# Patient Record
Sex: Female | Born: 2000 | Race: White | Hispanic: No | Marital: Single
Health system: Midwestern US, Community
[De-identification: ages and names within clinical notes are randomized; demographics above are authoritative.]

## PROBLEM LIST (undated history)

## (undated) DIAGNOSIS — F900 Attention-deficit hyperactivity disorder, predominantly inattentive type: Secondary | ICD-10-CM

## (undated) DIAGNOSIS — Z0000 Encounter for general adult medical examination without abnormal findings: Secondary | ICD-10-CM

## (undated) DIAGNOSIS — J324 Chronic pansinusitis: Secondary | ICD-10-CM

## (undated) DIAGNOSIS — S52599A Other fractures of lower end of unspecified radius, initial encounter for closed fracture: Secondary | ICD-10-CM

## (undated) DIAGNOSIS — M0880 Other juvenile arthritis, unspecified site: Secondary | ICD-10-CM

## (undated) DIAGNOSIS — M25462 Effusion, left knee: Principal | ICD-10-CM

## (undated) DIAGNOSIS — M25461 Effusion, right knee: Secondary | ICD-10-CM

## (undated) DIAGNOSIS — M1990 Unspecified osteoarthritis, unspecified site: Secondary | ICD-10-CM

## (undated) HISTORY — DX: Unspecified osteoarthritis, unspecified site: M19.90

## (undated) HISTORY — PX: WISDOM TOOTH EXTRACTION: SHX21

## (undated) MED ORDER — LIDOCAINE HCL 1 % (10 MG/ML) IJ SOLN
10 mg/mL (1 %) | Freq: Once | INTRAMUSCULAR | Status: AC
Start: ? — End: 2013-02-06

## (undated) MED ORDER — NAPROXEN 375 MG TAB
375 mg | ORAL_TABLET | Freq: Two times a day (BID) | ORAL | Status: DC
Start: ? — End: 2014-08-21

## (undated) MED ORDER — NAPROXEN 500 MG TAB
500 mg | ORAL_TABLET | Freq: Every day | ORAL | Status: DC
Start: ? — End: 2013-06-28

## (undated) MED ORDER — NAPROXEN 500 MG TAB
500 mg | ORAL_TABLET | Freq: Two times a day (BID) | ORAL | Status: AC
Start: ? — End: 2013-02-10

## (undated) MED ORDER — NAPROXEN 375 MG TAB
375 mg | ORAL_TABLET | Freq: Every day | ORAL | Status: DC
Start: ? — End: 2013-06-28

---
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
## 2021-01-07 ENCOUNTER — Encounter

## 2021-01-07 NOTE — Telephone Encounter (Signed)
PT referral made for LE pain ; follow up prn

## 2021-01-07 NOTE — Telephone Encounter (Signed)
Telephone Encounter by Ysidro Evert, MD at 01/07/21 1907                Author: Ysidro Evert, MD  Service: --  Author Type: Physician       Filed: 01/07/21 1907  Encounter Date: 01/07/2021  Status: Signed          Editor: Ysidro Evert, MD (Physician)          From: Daivd Council: Ysidro Evert, MDSent: 01/07/2021  4:07 PM EDTSubject: Physical TherapyHi Dr. Bertrum Sol hope you are doing well!  In the past few weeks, my leg issues have worsened and spread as well. I have had pain in my shins, top and bottom of foot, knees, and hip when I walk even short distances. My mom told me that I would need a prescription from you before going to physical  therapy and got an appointment at [facility]. Thank you so much for your help!!

## 2021-02-10 ENCOUNTER — Ambulatory Visit: Admit: 2021-02-10 | Attending: Internal Medicine | Primary: Internal Medicine

## 2021-02-10 ENCOUNTER — Ambulatory Visit: Attending: Internal Medicine | Primary: Internal Medicine

## 2021-02-10 DIAGNOSIS — Z0000 Encounter for general adult medical examination without abnormal findings: Secondary | ICD-10-CM

## 2021-02-10 LAB — AMB POC URINALYSIS DIP STICK AUTO W/O MICRO
Bilirubin (UA POC): NEGATIVE
Bilirubin, Urine, POC: NEGATIVE
Blood (UA POC): NEGATIVE
Blood (UA POC): NEGATIVE
Glucose (UA POC): NEGATIVE
Glucose, Urine, POC: NEGATIVE
Ketones (UA POC): NEGATIVE
Ketones, Urine, POC: NEGATIVE
Leukocyte Esterase, Urine, POC: NEGATIVE
Leukocyte esterase (UA POC): NEGATIVE
Nitrite, Urine, POC: NEGATIVE
Nitrites (UA POC): NEGATIVE
Protein (UA POC): NEGATIVE
Protein, Urine, POC: NEGATIVE
Specific Gravity, Urine, POC: 1.02 NA (ref 1.001–1.035)
Specific gravity (UA POC): 1.02 (ref 1.001–1.035)
Urobilinogen (UA POC): 0.2 (ref 0.2–1)
Urobilinogen, POC: 0.2 (ref 0.2–1)
pH (UA POC): 6 (ref 4.6–8.0)
pH, Urine, POC: 6 NA (ref 4.6–8.0)

## 2021-02-10 LAB — AMB POC HEMOGLOBIN A1C
Hemoglobin A1C, POC: 4.6 % — AB (ref 4.8–5.6)
Hemoglobin A1c (POC): 4.6 % — AB (ref 4.8–5.6)

## 2021-02-10 NOTE — Progress Notes (Signed)
Left VM for mother with results. Continue same rx. Follow up prn

## 2021-02-10 NOTE — Progress Notes (Signed)
Gaia Gullikson is a 20 y.o. Itzel Lowrimore/f CC: Routine Physical     HPI  Routine Physical     Past Medical History:   Diagnosis Date   ??? ADD (attention deficit disorder)    ??? Arthritis     Juvenille Idiopathic Arthritis . oligo articular   ??? History of seasonal allergies    ??? Patellofemoral arthritis      No past surgical history on file.  Current Outpatient Medications   Medication Sig Dispense Refill   ??? Vyvanse 60 mg capsule TAKE ONE CAPSULE BY MOUTH EVERY DAY.Marland KitchenMAX DAILY AMOUNT 60MG  90 Capsule 0   ??? methylphenidate HCl (RITALIN) 10 mg tablet Take 1 Tablet by mouth daily. Max Daily Amount: 10 mg. In the mid afternoon 50 Tablet 0   ??? Lo Loestrin Fe 1 mg-10 mcg (24)/10 mcg (2) tab      ??? naproxen sodium (NAPROSYN) 220 mg tablet Take 220 mg by mouth as needed (Takes as directed by the doctor). Indications: joint inflammatory disease in children and young adults (Patient not taking: Reported on 01/26/2020)       Allergies   Allergen Reactions   ??? Accutane [Isotretinoin] Rash     No family history on file.  Social History     Tobacco Use   Smoking Status Never Smoker   Smokeless Tobacco Never Used       Review of Systems  Am thirst, headache, dry lips. Vision ok . Denies dyspnea.  Cardiac:  Denies chest pain  GI: Bowels move regularly without hematochezia..  With increased water intake , urinary frequency . Lo esterin . Dr 01/28/2020. No menses.   She had B/l leg pain responded well to Right hip PT  Passes Depression screen     Physical Examination:  Visit Vitals  BP 94/72   Temp 97.9 ??F (36.6 ??C)   Ht 5\' 10"  (1.778 m)   Wt 172 lb (78 kg)   BMI 24.68 kg/m??     Physical Exam  General:   Appears in no acute distress.   HEENT:   Negative                   Lungs:   Clear        Heart:  Regular without murmur, gallop or rub       Abdomen:   Benign exam without organomegaly or mass palpable                Rectal:     Extremities: No edema hips FROM    Neurologic: Grossly nonfocal  .    No results found for this or any previous  visit (from the past 8 hour(s)).              Results for orders placed or performed in visit on 02/10/21   AMB POC URINALYSIS DIP STICK AUTO Vinayak Bobier/O MICRO   Result Value Ref Range    Color (UA POC) Yellow     Clarity (UA POC) Clear     Glucose (UA POC) Negative Negative    Bilirubin (UA POC) Negative Negative    Ketones (UA POC) Negative Negative    Specific gravity (UA POC) 1.020 1.001 - 1.035    Blood (UA POC) Negative Negative    pH (UA POC) 6.0 4.6 - 8.0    Protein (UA POC) Negative Negative    Urobilinogen (UA POC) 0.2 mg/dL 0.2 - 1    Nitrites (UA POC) Negative Negative  Leukocyte esterase (UA POC) Negative Negative   AMB POC HEMOGLOBIN A1C   Result Value Ref Range    Hemoglobin A1c (POC) 4.6 (A) 4.8 - 5.6 %     Assessment/Plan    ICD-10-CM ICD-9-CM    1. Routine general medical examination at a health care facility  Z00.00 V70.0 COLLECTION VENOUS BLOOD,VENIPUNCTURE      LIPID PANEL      CBC WITH AUTOMATED DIFF      METABOLIC PANEL, COMPREHENSIVE      AMB POC URINALYSIS DIP STICK AUTO Natasha Burda/O MICRO      AMB POC HEMOGLOBIN A1C   2. ADHD (attention deficit hyperactivity disorder), inattentive type  F90.0 314.00 OPIATES CONFIRMATION, URINE     Continue home exercises   Check UDS. PMP appears in order  Continue routine f/u Dr Malachy Chamber   Repeat UDS in 6 m and CPE in 1y ; f/u sooner prn     Author:  Ysidro Evert, MD 2:20 PM6/02/2021

## 2021-02-11 LAB — COMPREHENSIVE METABOLIC PANEL
ALT: 16 IU/L (ref 0–32)
AST: 18 IU/L (ref 0–40)
Albumin/Globulin Ratio: 1.8 NA (ref 1.2–2.2)
Albumin: 4.6 g/dL (ref 3.9–5.0)
Alkaline Phosphatase: 57 IU/L (ref 42–106)
BUN: 8 mg/dL (ref 6–20)
Bun/Cre Ratio: 12 NA (ref 9–23)
CO2: 24 mmol/L (ref 20–29)
Calcium: 9.4 mg/dL (ref 8.7–10.2)
Chloride: 104 mmol/L (ref 96–106)
Creatinine: 0.68 mg/dL (ref 0.57–1.00)
Est, Glomerular Filtration Rate: 129 mL/min/{1.73_m2} (ref >59)
Globulin, Total: 2.6 g/dL (ref 1.5–4.5)
Glucose: 89 mg/dL (ref 65–99)
Potassium: 4.5 mmol/L (ref 3.5–5.2)
Sodium: 142 mmol/L (ref 134–144)
Total Bilirubin: 0.2 mg/dL (ref 0.0–1.2)
Total Protein: 7.2 g/dL (ref 6.0–8.5)

## 2021-02-11 LAB — CBC WITH AUTO DIFFERENTIAL
Basophils %: 1 %
Basophils Absolute: 0.1 10*3/uL (ref 0.0–0.2)
Eosinophils %: 3 %
Eosinophils Absolute: 0.1 10*3/uL (ref 0.0–0.4)
Granulocyte Absolute Count: 0 10*3/uL (ref 0.0–0.1)
Hematocrit: 40.7 % (ref 34.0–46.6)
Hemoglobin: 13.9 g/dL (ref 11.1–15.9)
Immature Granulocytes: 0 %
Lymphocytes %: 44 %
Lymphocytes Absolute: 1.9 10*3/uL (ref 0.7–3.1)
MCH: 30.8 pg (ref 26.6–33.0)
MCHC: 34.2 g/dL (ref 31.5–35.7)
MCV: 90 fL (ref 79–97)
Monocytes %: 8 %
Monocytes Absolute: 0.3 10*3/uL (ref 0.1–0.9)
Neutrophils %: 44 %
Neutrophils Absolute: 1.9 10*3/uL (ref 1.4–7.0)
Platelets: 301 10*3/uL (ref 150–450)
RBC: 4.52 x10E6/uL (ref 3.77–5.28)
RDW: 11.6 % — ABNORMAL LOW (ref 11.7–15.4)
WBC: 4.3 10*3/uL (ref 3.4–10.8)

## 2021-02-11 LAB — LIPID PANEL
Cholesterol, Total: 213 mg/dL — ABNORMAL HIGH (ref 100–169)
Cholesterol, total: 213 mg/dL — ABNORMAL HIGH (ref 100–169)
HDL Cholesterol: 60 mg/dL (ref >39)
HDL: 60 mg/dL (ref >39)
LDL Calculated: 137 mg/dL — ABNORMAL HIGH (ref 0–109)
LDL, calculated: 137 mg/dL — ABNORMAL HIGH (ref 0–109)
Triglyceride: 92 mg/dL — ABNORMAL HIGH (ref 0–89)
Triglycerides: 92 mg/dL — ABNORMAL HIGH (ref 0–89)
VLDL, calculated: 16 mg/dL (ref 5–40)
VLDL: 16 mg/dL (ref 5–40)

## 2021-02-11 LAB — CBC WITH AUTOMATED DIFF
ABS. BASOPHILS: 0.1 10*3/uL (ref 0.0–0.2)
ABS. EOSINOPHILS: 0.1 10*3/uL (ref 0.0–0.4)
ABS. IMM. GRANS.: 0 10*3/uL (ref 0.0–0.1)
ABS. MONOCYTES: 0.3 10*3/uL (ref 0.1–0.9)
ABS. NEUTROPHILS: 1.9 10*3/uL (ref 1.4–7.0)
Abs Lymphocytes: 1.9 10*3/uL (ref 0.7–3.1)
BASOPHILS: 1 %
EOSINOPHILS: 3 %
HCT: 40.7 % (ref 34.0–46.6)
HGB: 13.9 g/dL (ref 11.1–15.9)
IMMATURE GRANULOCYTES: 0 %
Lymphocytes: 44 %
MCH: 30.8 pg (ref 26.6–33.0)
MCHC: 34.2 g/dL (ref 31.5–35.7)
MCV: 90 fL (ref 79–97)
MONOCYTES: 8 %
NEUTROPHILS: 44 %
PLATELET: 301 10*3/uL (ref 150–450)
RBC: 4.52 x10E6/uL (ref 3.77–5.28)
RDW: 11.6 % — ABNORMAL LOW (ref 11.7–15.4)
WBC: 4.3 10*3/uL (ref 3.4–10.8)

## 2021-02-11 LAB — METABOLIC PANEL, COMPREHENSIVE
A-G Ratio: 1.8 (ref 1.2–2.2)
ALT (SGPT): 16 IU/L (ref 0–32)
AST (SGOT): 18 IU/L (ref 0–40)
Albumin: 4.6 g/dL (ref 3.9–5.0)
Alk. phosphatase: 57 IU/L (ref 42–106)
BUN/Creatinine ratio: 12 (ref 9–23)
BUN: 8 mg/dL (ref 6–20)
Bilirubin, total: 0.2 mg/dL (ref 0.0–1.2)
CO2: 24 mmol/L (ref 20–29)
Calcium: 9.4 mg/dL (ref 8.7–10.2)
Chloride: 104 mmol/L (ref 96–106)
Creatinine: 0.68 mg/dL (ref 0.57–1.00)
GLOBULIN, TOTAL: 2.6 g/dL (ref 1.5–4.5)
Glucose: 89 mg/dL (ref 65–99)
Potassium: 4.5 mmol/L (ref 3.5–5.2)
Protein, total: 7.2 g/dL (ref 6.0–8.5)
Sodium: 142 mmol/L (ref 134–144)
eGFR: 129 mL/min/{1.73_m2} (ref >59)

## 2021-02-14 ENCOUNTER — Encounter

## 2021-02-14 MED ORDER — LISDEXAMFETAMINE 20 MG CAP
20 mg | ORAL_CAPSULE | Freq: Every day | ORAL | 0 refills | Status: DC
Start: 2021-02-14 — End: 2021-02-19

## 2021-02-14 NOTE — Telephone Encounter (Signed)
Telephone Encounter by Ysidro Evert, MD at 02/14/21 1343                Author: Ysidro Evert, MD  Service: --  Author Type: Physician       Filed: 02/14/21 1343  Encounter Date: 02/14/2021  Status: Signed          Editor: Ysidro Evert, MD (Physician)          From: Daivd Council: Ysidro Evert, MDSent: 02/14/2021 10:20 AM EDTSubject: Vyvanse Prescription Hi Dr. Doreene Eland! I hope you are doing  well. I was wondering if you could please refill my Vyvanse prescription so I can pick it up at the pharmacy in a few days. Thank you!

## 2021-02-18 ENCOUNTER — Telehealth

## 2021-02-18 MED ORDER — LISDEXAMFETAMINE 20 MG CAP
20 mg | ORAL_CAPSULE | Freq: Every day | ORAL | 0 refills | Status: DC
Start: 2021-02-18 — End: 2021-02-19

## 2021-02-18 NOTE — Telephone Encounter (Signed)
Dr. Doreene Eland,  Patient wrote in the wrong dosage for her VYVANSE she needs 60 mg of that so could you please send in the     VYVANSE 60 mg - 90 days     Pt ph# [phone]    [facility] ph# [phone]

## 2021-02-18 NOTE — Telephone Encounter (Signed)
I prescribed Vyvanse 20 mg #90  As per pt 's request . This replaces the recent 3 prescriptions for 30 tabs each.

## 2021-02-18 NOTE — Addendum Note (Signed)
Addendum Note by Ysidro Evert, MD at 02/18/21 1325                Author: Ysidro Evert, MD  Service: --  Author Type: Physician       Filed: 02/18/21 1325  Encounter Date: 02/14/2021  Status: Signed          Editor: Ysidro Evert, MD (Physician)          Addended by: Coralyn Pear IV on: 02/18/2021 01:25 PM    Modules accepted: Orders

## 2021-02-19 LAB — OPIATES CONFIRMATION, URINE: Opiates: NEGATIVE ng/mL

## 2021-02-19 MED ORDER — LISDEXAMFETAMINE 60 MG CAPSULE
60 mg | ORAL_CAPSULE | ORAL | 0 refills | Status: DC
Start: 2021-02-19 — End: 2021-05-08

## 2021-02-19 NOTE — Telephone Encounter (Signed)
Vyvanse 60 mg ERX

## 2021-03-19 ENCOUNTER — Encounter: Attending: Orthopaedic Surgery | Primary: Internal Medicine

## 2021-04-15 ENCOUNTER — Ambulatory Visit: Admit: 2021-04-15 | Payer: BLUE CROSS/BLUE SHIELD | Attending: Rheumatology | Primary: Internal Medicine

## 2021-04-15 ENCOUNTER — Ambulatory Visit: Attending: Rheumatology | Primary: Internal Medicine

## 2021-04-15 DIAGNOSIS — M2550 Pain in unspecified joint: Secondary | ICD-10-CM

## 2021-04-15 MED ORDER — MELOXICAM 15 MG TAB
15 mg | ORAL_TABLET | Freq: Every day | ORAL | 1 refills | Status: AC
Start: 2021-04-15 — End: 2021-05-15

## 2021-04-15 MED ORDER — DICLOFENAC 1 % TOPICAL GEL
1 % | Freq: Four times a day (QID) | CUTANEOUS | 3 refills | Status: AC
Start: 2021-04-15 — End: 2021-05-15

## 2021-04-15 NOTE — Progress Notes (Signed)
Chief Complaint   Patient presents with    Joint Pain     1. Have you been to the ER, urgent care clinic since your last visit?  Hospitalized since your last visit?No    2. Have you seen or consulted any other health care providers outside of the [facility] since your last visit?  Include any pap smears or colon screening. No

## 2021-04-15 NOTE — Progress Notes (Signed)
RHEUMATOLOGY PROBLEM LIST AND CHIEF COMPLAINT  1. 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 Caucasian female.  Today, the patient complains of pain in the joints.  Location: knee and ankles  Severity:  8 on a scale of 0-10  Timing: all day   Duration: Over 2 months  Context/Associated signs and symptoms: The patient was last seen in May 2021 however has not actually had an inflammatory arthritis since 2014.  She has been seen in the past for mechanical joint pain as well as a rash secondary to Accutane.  Over the last few months while at camp she has developed significant pain in her knees, shinsplints and ankle pain.  She attributes this to walking and activity that has been taking place at camp.  While she was away at school in [place] she also began to have pain in her knees as well as shinsplints from activity.  She denies morning stiffness however does say that her knees feel swollen similar to when they were swollen when she was diagnosed with juvenile arthritis.  She has not had fevers or rashes is doing well otherwise.  She is currently taking Aleve daily and her high doses as well as supplementing it with Tylenol.    RHEUMATOLOGY REVIEW OF SYSTEMS   Positives as per history  Negatives as follows:  CONSTITUTlONAL:  Denies unexplained persistent fevers, weight change, chronic fatigue  HEAD/EYES:   Denies eye redness, blurry vision or sudden loss of vision, dry eyes, HA, temporal artery pain  ENT:    Denies oral/nasal ulcers, recurrent sinus infections, dry mouth  RESPIRATORY:  No pleuritic pain, history of pleural effusions, hemoptysis, exertional dyspnea  CARDIOVASCULAR: Denies chest pain, history of pericardial effusions  GASTRO:   Denies heartburn, esophageal dysmotility, abdominal pain, nausea, vomiting, diarrhea, blood in the  stool  HEMATOLOGIC:  No easy bruising, purpura, swollen lymph nodes  SKIN:    Denies alopecia, ulcers, nodules, sun sensitivity  VASCULAR:   Denies edema, cyanosis, raynaud phenomenon  NEUROLOGIC:  Denies specific muscle weakness, paresthesias   PSYCHIATRIC:  No sleep disturbance / snoring, depression, anxiety  MSK:    No morning stiffness >1 hour, SI joint pain, persistent joint swelling, persistent joint pain    PAST MEDICAL HISTORY  Reviewed with patient, significant changes in medical history - no    PHYSICAL EXAM  Blood pressure 125/79, pulse 90, temperature 98.7 ??F (37.1 ??C), temperature source Oral, resp. rate 16, weight 160 lb (72.6 kg), SpO2 98 %.  GENERAL APPEARANCE: Well-nourished, no acute distress  NECK: No adenopathy  ENT: No oral ulcers  CARDIOVASCULAR: Heart rhythm is regular. No murmur, rub, gallop  CHEST: Normal vesicular breath sounds. No wheezes, rales, pleural friction rubs  ABDOMINAL: The abdomen is soft and nontender. Bowel sounds are normal  SKIN: No rash, palpable purpura, digital ulcer, abnormal thickening   MUSCULOSKELETAL:   Upper extremities - full range of motion, no tenderness, no swelling, no synovial thickening and no deformity of joints   Lower extremities - full range of motion, no tenderness, no swelling, no synovial thickening and no deformity of joints positive grind test, no synovitis noted in either knee subtle swelling of bilateral knee with no warmth or decreased range of motion    LABS, RADIOLOGY AND PROCEDURES   No new labs, radiology, or procedures to review  ASSESSMENT   1. Juvenile idiopathic arthritis -(is unchanged)-I do not suspect that her current symptoms are from a flare of juvenile arthritis.  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  I discussed using other NSAIDs instead of Aleve since this is not providing relief as well as using topical diclofenac gel.  If the symptoms  persist we will obtain an MRI however at this point I feel like after she stops can she should start to feel better.  The constant uphill and downhill walking is likely contributing to this discomfort.  2. 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  There is pain and crepitus in the patellar region which is worsened during overactivity like climbing stairs.  There was a positive patella grind test.  The treatment is quadriceps strengthening through stretching along with the use of NSAIDs and avoidance of overuse.     PLAN  1.  Recommended quadriceps strengthening and grasping exercises  2.  Diclofenac gel and meloxicam as needed   3. Follow up as needed     Camika Marsico M. Allena Katz, MD  Adult and Pediatric Rheumatology     [facility]   [address] [place], [place] [address], Phone [phone], Fax [phone]     Visiting Assistant Professor of Pediatrics    Department of Pediatrics, [facility]   [address], [place], [place] [address], Phone [phone], Fax [phone]    There are no Patient Instructions on file for this visit.    cc:  Ysidro Evert, MD   S. Philis Nettle, MD

## 2021-05-08 ENCOUNTER — Encounter

## 2021-05-09 ENCOUNTER — Telehealth

## 2021-05-09 MED ORDER — LISDEXAMFETAMINE 60 MG CAPSULE
60 mg | ORAL_CAPSULE | Freq: Every day | ORAL | 0 refills | Status: AC
Start: 2021-05-09 — End: 2021-10-27

## 2021-05-09 MED ORDER — LISDEXAMFETAMINE 60 MG CAPSULE
60 mg | ORAL_CAPSULE | ORAL | 0 refills | Status: AC
Start: 2021-05-09 — End: 2021-08-17

## 2021-05-09 MED ORDER — LISDEXAMFETAMINE 60 MG CAPSULE
60 mg | ORAL_CAPSULE | Freq: Every day | ORAL | 0 refills | Status: AC
Start: 2021-05-09 — End: 2021-12-12

## 2021-05-09 NOTE — Telephone Encounter (Signed)
ERX Vyvanse generic sent to pharmacy. PMP appears in order

## 2021-05-09 NOTE — Telephone Encounter (Signed)
PMP appears in order. Vyvanse ordered. I called pharmacy

## 2021-05-09 NOTE — Telephone Encounter (Signed)
Pharmacy called and wanted to inform the doctor about the prescription for Vyvanse 60 mg for 90 day supply that they received today. Pharmacy stated the insurance will only cover for 30 days. Please send in another script for 30 days so they may have on file for the other half of the medication.  Please call     [facility]# [phone]

## 2021-06-19 ENCOUNTER — Ambulatory Visit: Admit: 2021-06-19 | Discharge: 2021-06-20 | Attending: Internal Medicine | Primary: Internal Medicine

## 2021-06-19 ENCOUNTER — Ambulatory Visit: Attending: Internal Medicine | Primary: Internal Medicine

## 2021-06-19 DIAGNOSIS — Z0000 Encounter for general adult medical examination without abnormal findings: Secondary | ICD-10-CM

## 2021-06-19 LAB — AMB POC URINALYSIS DIP STICK AUTO W/O MICRO
Bilirubin (UA POC): NEGATIVE
Bilirubin, Urine, POC: NEGATIVE
Blood (UA POC): NEGATIVE
Blood (UA POC): NEGATIVE
Glucose (UA POC): NEGATIVE
Glucose, Urine, POC: NEGATIVE
Ketones (UA POC): NEGATIVE
Ketones, Urine, POC: NEGATIVE
Leukocyte Esterase, Urine, POC: NEGATIVE
Leukocyte esterase (UA POC): NEGATIVE
Nitrite, Urine, POC: NEGATIVE
Nitrites (UA POC): NEGATIVE
Protein (UA POC): NEGATIVE
Protein, Urine, POC: NEGATIVE
Specific Gravity, Urine, POC: 1.01 NA (ref 1.001–1.035)
Specific gravity (UA POC): 1.01 (ref 1.001–1.035)
Urobilinogen (UA POC): 0.2 (ref 0.2–1)
Urobilinogen, POC: 0.2 (ref 0.2–1)
pH (UA POC): 7 (ref 4.6–8.0)
pH, Urine, POC: 7 NA (ref 4.6–8.0)

## 2021-06-19 MED ORDER — AMPHETAMINE-DEXTROAMPHETAMINE 10 MG TAB
10 mg | ORAL_TABLET | Freq: Every day | ORAL | 0 refills | Status: AC
Start: 2021-06-19 — End: 2021-07-21

## 2021-06-19 NOTE — Progress Notes (Signed)
Elaine Terrell is a 20 y.o. wf  CC: Routine Physical     HPI    Routine Physical   Past Medical History:   Diagnosis Date    ADD (attention deficit disorder)     Arthritis     Juvenille Idiopathic Arthritis . oligo articular    History of seasonal allergies     Patellofemoral arthritis      No past surgical history on file.  Current Outpatient Medications   Medication Sig Dispense Refill    OTHER Culturelle one daily . Beano daly      dextroamphetamine-amphetamine (ADDERALL) 10 mg tablet Take 1 Tablet by mouth daily. Max Daily Amount: 10 mg. 30 Tablet 0    Lisdexamfetamine (Vyvanse) 60 mg capsule TAKE ONE CAPSULE BY MOUTH EVERY DAY.Marland KitchenMAX DAILY AMOUNT 60MG  90 Capsule 0    Lisdexamfetamine (VYVANSE) 60 mg capsule Take 1 Capsule by mouth daily. Max Daily Amount: 60 mg. 30 Capsule 0    Lisdexamfetamine (VYVANSE) 60 mg capsule Take 1 Capsule by mouth daily. Max Daily Amount: 60 mg. 30 Capsule 0    B.animalis,bifid,infantis,long (Probiotic 4X) 10-15 mg TbEC Take  by mouth.      spironolactone (ALDACTONE) 25 mg tablet Take 50 mg by mouth in the morning.      Lo Loestrin Fe 1 mg-10 mcg (24)/10 mcg (2) tab       naproxen sodium (NAPROSYN) 220 mg tablet Take 220 mg by mouth as needed (Takes as directed by the doctor). Indications: joint inflammatory disease in children and young adults       Allergies   Allergen Reactions    Accutane [Isotretinoin] Rash    Ritalin [Methylphenidate] Other (comments)     Palpitations      No family history on file.  Social History     Tobacco Use   Smoking Status Never   Smokeless Tobacco Never       Review of Systems  Denies dyspnea.  Cardiac:  Denies chest pai  GI: low FODMAP , off Meloxicam, on Culturelle, Beano now abd feels ok . DR  is her GI . She says her bm's wels move regularly without hematochezia.Inda Coke  Urination without difficulty. On OCP ; no Menses. GYN UTD. Legs better Christna Kulick/ PT  Ritalin irriatalbe and palpitations.     Physical Examination:  Visit Vitals  BP 112/82   Temp  97.7 ??F (36.5 ??C)   Ht 5\' 11"  (1.803 m)   Wt 160 lb (72.6 kg)   BMI 22.32 kg/m??     Physical Exam  General:   Appears in no acute distress.   HEENT:   Negative                   Lungs:   Clear        Heart:  Regular without murmur, gallop or rub       Abdomen:   Benign exam without organomegaly or mass palpable                Rectal:     Extremities: No edema le from l no swelling    Neurologic: Grossly nonfocal  .    Recent Results (from the past 8 hour(s))   AMB POC URINALYSIS DIP STICK AUTO Kerriann Kamphuis/O MICRO    Collection Time: 06/19/21 11:39 AM   Result Value Ref Range    Color (UA POC) Yellow     Clarity (UA POC) Clear     Glucose (UA POC) Negative Negative  Bilirubin (UA POC) Negative Negative    Ketones (UA POC) Negative Negative    Specific gravity (UA POC) 1.010 1.001 - 1.035    Blood (UA POC) Negative Negative    pH (UA POC) 7.0 4.6 - 8.0    Protein (UA POC) Negative Negative    Urobilinogen (UA POC) 0.2 mg/dL 0.2 - 1    Nitrites (UA POC) Negative Negative    Leukocyte esterase (UA POC) Negative Negative                   Assessment/Plan    ICD-10-CM ICD-9-CM    1. Routine general medical examination at a health care facility  Z00.00 V70.0 COLLECTION VENOUS BLOOD,VENIPUNCTURE      METABOLIC PANEL, COMPREHENSIVE      CBC WITH AUTOMATED DIFF      LIPID PANEL      AMB POC URINALYSIS DIP STICK AUTO Kimm Ungaro/O MICRO      2. ADHD (attention deficit hyperactivity disorder), inattentive type  F90.0 314.00 OPIATES CONFIRMATION, URINE      dextroamphetamine-amphetamine (ADDERALL) 10 mg tablet        Recent mechanical leg pain including shin splints. Better   Continue PT    ADHD. Stay off Ritalin. Instead use Adderall IR 10 mg every day prn . Continue Vyvanse. PMP appears in order ; UDS ordered     RV 6 m or prn sooner     Author:  Ysidro Evert, MD 11:07 AM10/13/2022

## 2021-06-22 LAB — CBC WITH AUTO DIFFERENTIAL
Basophils %: 1 %
Basophils Absolute: 0 10*3/uL (ref 0.0–0.2)
Eosinophils %: 2 %
Eosinophils Absolute: 0.1 10*3/uL (ref 0.0–0.4)
Granulocyte Absolute Count: 0 10*3/uL (ref 0.0–0.1)
Hematocrit: 41.9 % (ref 34.0–46.6)
Hemoglobin: 14.4 g/dL (ref 11.1–15.9)
Immature Granulocytes: 0 %
Lymphocytes %: 45 %
Lymphocytes Absolute: 1.3 10*3/uL (ref 0.7–3.1)
MCH: 30.8 pg (ref 26.6–33.0)
MCHC: 34.4 g/dL (ref 31.5–35.7)
MCV: 90 fL (ref 79–97)
Monocytes %: 6 %
Monocytes Absolute: 0.2 10*3/uL (ref 0.1–0.9)
Neutrophils %: 46 %
Neutrophils Absolute: 1.4 10*3/uL (ref 1.4–7.0)
Platelets: 280 10*3/uL (ref 150–450)
RBC: 4.68 x10E6/uL (ref 3.77–5.28)
RDW: 11.2 % — ABNORMAL LOW (ref 11.7–15.4)
WBC: 3 10*3/uL — ABNORMAL LOW (ref 3.4–10.8)

## 2021-06-22 LAB — COMPREHENSIVE METABOLIC PANEL
ALT: 14 IU/L (ref 0–32)
AST: 17 IU/L (ref 0–40)
Albumin/Globulin Ratio: 2.1 NA (ref 1.2–2.2)
Albumin: 4.8 g/dL (ref 3.9–5.0)
Alkaline Phosphatase: 53 IU/L (ref 42–106)
BUN: 9 mg/dL (ref 6–20)
Bun/Cre Ratio: 11 NA (ref 9–23)
CO2: 24 mmol/L (ref 20–29)
Calcium: 9.7 mg/dL (ref 8.7–10.2)
Chloride: 99 mmol/L (ref 96–106)
Creatinine: 0.84 mg/dL (ref 0.57–1.00)
Est, Glomerular Filtration Rate: 103 mL/min/{1.73_m2} (ref >59)
Globulin, Total: 2.3 g/dL (ref 1.5–4.5)
Glucose: 92 mg/dL (ref 70–99)
Potassium: 4.6 mmol/L (ref 3.5–5.2)
Sodium: 138 mmol/L (ref 134–144)
Total Bilirubin: 0.6 mg/dL (ref 0.0–1.2)
Total Protein: 7.1 g/dL (ref 6.0–8.5)

## 2021-06-22 LAB — LIPID PANEL
Cholesterol, Total: 256 mg/dL — ABNORMAL HIGH (ref 100–169)
Cholesterol, total: 256 mg/dL — ABNORMAL HIGH (ref 100–169)
HDL Cholesterol: 72 mg/dL (ref >39)
HDL: 72 mg/dL (ref >39)
LDL Calculated: 173 mg/dL — ABNORMAL HIGH (ref 0–109)
LDL, calculated: 173 mg/dL — ABNORMAL HIGH (ref 0–109)
Triglyceride: 68 mg/dL (ref 0–89)
Triglycerides: 68 mg/dL (ref 0–89)
VLDL, calculated: 11 mg/dL (ref 5–40)
VLDL: 11 mg/dL (ref 5–40)

## 2021-06-22 LAB — METABOLIC PANEL, COMPREHENSIVE
A-G Ratio: 2.1 (ref 1.2–2.2)
ALT (SGPT): 14 IU/L (ref 0–32)
AST (SGOT): 17 IU/L (ref 0–40)
Albumin: 4.8 g/dL (ref 3.9–5.0)
Alk. phosphatase: 53 IU/L (ref 42–106)
BUN/Creatinine ratio: 11 (ref 9–23)
BUN: 9 mg/dL (ref 6–20)
Bilirubin, total: 0.6 mg/dL (ref 0.0–1.2)
CO2: 24 mmol/L (ref 20–29)
Calcium: 9.7 mg/dL (ref 8.7–10.2)
Chloride: 99 mmol/L (ref 96–106)
Creatinine: 0.84 mg/dL (ref 0.57–1.00)
GLOBULIN, TOTAL: 2.3 g/dL (ref 1.5–4.5)
Glucose: 92 mg/dL (ref 70–99)
Potassium: 4.6 mmol/L (ref 3.5–5.2)
Protein, total: 7.1 g/dL (ref 6.0–8.5)
Sodium: 138 mmol/L (ref 134–144)
eGFR: 103 mL/min/{1.73_m2} (ref >59)

## 2021-06-22 LAB — CBC WITH AUTOMATED DIFF
ABS. BASOPHILS: 0 10*3/uL (ref 0.0–0.2)
ABS. EOSINOPHILS: 0.1 10*3/uL (ref 0.0–0.4)
ABS. IMM. GRANS.: 0 10*3/uL (ref 0.0–0.1)
ABS. MONOCYTES: 0.2 10*3/uL (ref 0.1–0.9)
ABS. NEUTROPHILS: 1.4 10*3/uL (ref 1.4–7.0)
Abs Lymphocytes: 1.3 10*3/uL (ref 0.7–3.1)
BASOPHILS: 1 %
EOSINOPHILS: 2 %
HCT: 41.9 % (ref 34.0–46.6)
HGB: 14.4 g/dL (ref 11.1–15.9)
IMMATURE GRANULOCYTES: 0 %
Lymphocytes: 45 %
MCH: 30.8 pg (ref 26.6–33.0)
MCHC: 34.4 g/dL (ref 31.5–35.7)
MCV: 90 fL (ref 79–97)
MONOCYTES: 6 %
NEUTROPHILS: 46 %
PLATELET: 280 10*3/uL (ref 150–450)
RBC: 4.68 x10E6/uL (ref 3.77–5.28)
RDW: 11.2 % — ABNORMAL LOW (ref 11.7–15.4)
WBC: 3 10*3/uL — ABNORMAL LOW (ref 3.4–10.8)

## 2021-06-22 LAB — OPIATES CONFIRMATION, URINE: Opiates: NEGATIVE ng/mL

## 2021-06-22 NOTE — Telephone Encounter (Signed)
Pc- called mom Elaine Terrell/ results. Lipids may be affected by recent diet. P; healthy diet and exercise. Call back here prn

## 2021-07-21 ENCOUNTER — Encounter

## 2021-07-21 MED ORDER — AMPHETAMINE-DEXTROAMPHETAMINE 10 MG TAB
10 mg | ORAL_TABLET | ORAL | 0 refills | Status: DC
Start: 2021-07-21 — End: 2021-08-17

## 2021-08-17 ENCOUNTER — Encounter

## 2021-08-18 MED ORDER — AMPHETAMINE-DEXTROAMPHETAMINE 10 MG TAB
10 mg | ORAL_TABLET | Freq: Every day | ORAL | 0 refills | Status: DC
Start: 2021-08-18 — End: 2021-10-27

## 2021-08-18 MED ORDER — LISDEXAMFETAMINE 60 MG CAPSULE
60 mg | ORAL_CAPSULE | ORAL | 0 refills | Status: AC
Start: 2021-08-18 — End: ?

## 2021-08-18 NOTE — Telephone Encounter (Signed)
Pt needs a refill on her     Vyvanse 60 mg (90 days)    Adderall 10 mg (90 days)     Pt would like a 90 day supply. Pt would like a call back once completed.    Ph# [phone]    [facility]# [phone]

## 2021-10-17 ENCOUNTER — Encounter: Payer: Self-pay | Admitting: Gastroenterology

## 2021-10-17 ENCOUNTER — Ambulatory Visit (INDEPENDENT_AMBULATORY_CARE_PROVIDER_SITE_OTHER): Payer: BC Managed Care – PPO | Admitting: Gastroenterology

## 2021-10-17 VITALS — BP 128/80 | HR 108 | Ht 70.0 in | Wt 162.0 lb

## 2021-10-17 DIAGNOSIS — K589 Irritable bowel syndrome without diarrhea: Secondary | ICD-10-CM

## 2021-10-17 MED ORDER — DICYCLOMINE HCL 20 MG PO TABS: 20.0000 mg | ORAL_TABLET | Freq: Three times a day (TID) | ORAL | 5 refills | Status: DC

## 2021-10-17 NOTE — Progress Notes (Signed)
Referring Provider: Mamie Levers IV* Primary Care Physician:  Darlina Rumpf, MD  Reason for Consultation:  IBS   IMPRESSION:  IBS Soy allergy  Suspected irritable bowel syndrome. There is are no alarm features including weight loss, blood in the stool not caused by hemorrhoids or anal fissure, fever, nocturnal symptoms, or known family history of colon polyps, colon cancer, or celiac disease.  Prior testing has been negative for celiac and she has seen an allergist.   Other  IBS masqueraders include IBD, food intolerance (lactose, fructose, sucrose), SIBO, thyroid disorder. She feels particularly sensitive to sugars. Fecal calprotectin was previously normal.   PLAN: - Continue Culturelle - Strict trial of lowFODMAP diet - Trial of dicyclomine 20 mg QID PRN ideally taken prior to meals - Office follow-up in 8-10 weeks, earlier if needed - Low threshold for EGD and colonoscopy if not improving  HPI: Alexandra Pruitt is a 21 y.o. female referred by Dr. Wallene Huh, her primary care provider in [place], [place].  The history is obtained through the patient.  She has attention deficit disorder on Adderall and Vyvanse, arthritis, juvenile idiopathic arthritis, seasonal allergies, and patellofemoral arthritis. 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   Previously evaluated by Dr. Jovita Kussmaul in [place] for 2-year history of gastrointestinal symptoms triggered by foods particularly eggs.  She initially developed lower abdominal cramping followed by several bowel movements that are regular, nonbloody, and nondiarrheal like.  The bowel movements will subside the pain.  No diarrhea or constipation. No blood or mucous in the stool. Testing was negative for celiac and anemia.  She suggested a low FODMAP diet for probable irritable bowel syndrome.  She also saw an allergist who told her she has an allergy to soy.  No prior abdominal imaging or  endoscopic evaluation.   Abdominal pain continues despite strict compliance avoiding soy.    Labs from 2021 show a negative fecal lactoferrin, normal CBC except for an RDW of 11.3, normal TSH, and negative TTGA with a normal IgA  Trials include avoiding NSAIDs including meloxicam She tried a low FODMAP diet for 4 weeks without change (acknowledged that she was not strict with the diet) She tried gluten during that 4 weeks without change Using Culturelle and Beano Avoiding dairy provided no relief She wonders if sugar may be a trigger   She does not feel that she IBS.  Adopted. Family history unknown. Her non-biologic sister has celiac.   Past Medical History:  Diagnosis Date   Arthritis     Past Surgical History:  Procedure Laterality Date   WISDOM TOOTH EXTRACTION      Current Outpatient Medications  Medication Sig Dispense Refill   amphetamine-dextroamphetamine (ADDERALL) 10 MG tablet Take 10 mg by mouth daily.     LO LOESTRIN FE 1 MG-10 MCG / 10 MCG tablet Take 1 tablet by mouth daily.     Probiotic Product (PROBIOTIC DAILY) CAPS Take by mouth.     spironolactone (ALDACTONE) 50 MG tablet Take 50 mg by mouth 2 (two) times daily.     VYVANSE 60 MG capsule Take 60 mg by mouth daily.     No current facility-administered medications for this visit.    Allergies as of 10/17/2021 - Review Complete 10/17/2021  Allergen Reaction Noted   Methylphenidate Nausea And Vomiting 06/19/2021   Isotretinoin Rash 12/19/2020    Family History  Adopted: Yes    Social History   Socioeconomic History  Marital status: Single    Spouse name: Not on file   Number of children: Not on file   Years of education: Not on file   Highest education level: Not on file  Occupational History   Not on file  Tobacco Use   Smoking status: Never   Smokeless tobacco: Never  Substance and Sexual Activity   Alcohol use: Yes   Drug use: Not Currently   Sexual activity: Not on file  Other  Topics Concern   Not on file  Social History Narrative   Not on file   Social Determinants of Health   Financial Resource Strain: Not on file  Food Insecurity: Not on file  Transportation Needs: Not on file  Physical Activity: Not on file  Stress: Not on file  Social Connections: Not on file  Intimate Partner Violence: Not on file    Review of Systems: 12 system ROS is negative except as noted above.   Physical Exam: General:   Alert,  well-nourished, pleasant and cooperative in NAD Head:  Normocephalic and atraumatic. Eyes:  Sclera clear, no icterus.   Conjunctiva pink. Ears:  Normal auditory acuity. Nose:  No deformity, discharge,  or lesions. Mouth:  No deformity or lesions.   Neck:  Supple; no masses or thyromegaly. Lungs:  Clear throughout to auscultation.   No wheezes. Heart:  Regular rate and rhythm; no murmurs. Abdomen:  Soft, nontender, nondistended, normal bowel sounds, no rebound or guarding. No hepatosplenomegaly.   Rectal:  Deferred  Msk:  Symmetrical. No boney deformities LAD: No inguinal or umbilical LAD Extremities:  No clubbing or edema. Neurologic:  Alert and  oriented x4;  grossly nonfocal Skin:  Intact without significant lesions or rashes. Psych:  Alert and cooperative. Normal mood and affect.  Alexandra Pruitt L. Tarri Glenn, MD, MPH 10/17/2021, 9:38 AM

## 2021-10-17 NOTE — Patient Instructions (Addendum)
It was my pleasure to provide care to you today. Based on our discussion, I am providing you with my recommendations below:  RECOMMENDATION(S):  There isn't currently a cure for irritable bowel syndrome (IBS), but with a combination of medical treatment and diet an lifestyle adjustments, symptoms can be managed.  Because IBS if often made worse by stress, taking steps to manage stress in your life may help relieve symptoms. Some people find that relaxation techniques including breathing exercises, meditation, and yoga help reduce stress. As well, physical activity and adequate sleep can help control stress, as can any activity that you find calming - reading, listening to music, taking a walk with a friend, for example. Counseling and support from counselors, friends, family, and support groups can also help manage stress and help you find ways to avoid or navigate stressful situations.  Keep track of the foods that seem to trigger symptoms. Some people find that a written record, or food journal, helps them identify problem foods. Record details such as what you eat, what time of day, and how much and how you feel afterward. By avoiding foods that seem to cause discomfort, you may be able to reduce symptoms.   Some dietary tips that may help relief symptoms include: - Drink plenty of water and avoid carbonated beverages, which may cause gas - Don't chew gum or eat too quickly; these behaviors can cause you to swallow air, which can cause gas. - Try eating smaller meals more frequently and avoid large meals. - Try increasing fiber in your diet gradually. Fiber can help reduce both constipation and diarrhea, but it can also make gas and cramping worse. I recommended slowing increasing the amount of fiber that you eat. - Avoid foods and beverages that make you fee worse. For some people these include alcohol, drinks with caffeine, dairy products, beans, cauliflower, cabbage, and broccoli.   A common  dietary guideline for people suffering from IBS is a diet known as FODMAP. This stands for "fermentable oligodi-monosaccharides and polyols," or, more simply, certain types of carbohydrates found in foods that are hard to digest. By following FODMAP, also known as a low-FODMAP diet, you avoid or limit these particular carbohydrates. Some of the foods that contain FODMAPs include: - Fruits such as apples, apricots, blackberries, cherries, mango, nectarines, pears, plums, and watermelon, or juice containing any of these fruits - Canned fruit in natural fruit juice, or large quantities of fruit juice or dried fruit - Vegentables such as artichokes, asparagus, beans, cabbage, cauliflower, garlic and garlic salts, lentils, mushrooms, onions, and sugar snap or snow peas - Dairy products such as milk, milk products, soft cheese, yogurt, custard, and ice cream - Wheat and rye products - Honey and foods with high-fructose corn syrup  - Products, including candy and gum, with sweeteners ending in "-ol" (for example, sorbitol, mannitol, xylitol, and maltitol)  My favorite websites for more information include: https://lambert-jackson.net/ ReportNation.co.uk RelicTreasures.se  PRESCRIPTION MEDICATION(S):   We have sent the following medication(s) to your pharmacy:  I have recommended a trial of dicyclomine 20 mg taken four times day. You may find this most effective taken prior to meals. The most common side effect is dry mouth.  FOLLOW UP:  I would like for you to follow up with me in 3 months . Please call the office at [phone] to schedule your appointment.  BMI:  If you are age 52 or older, your body mass index should be between 23-30. Your Body mass index  is 23.24 kg/m. If this is out of the aforementioned range listed, please consider follow up with your Primary Care Provider.  If you are  age 85 or younger, your body mass index should be between 19-25. Your Body mass index is 23.24 kg/m. If this is out of the aformentioned range listed, please consider follow up with your Primary Care Provider.   MY CHART:  The [place] [facility] providers would like to encourage you to use [facility] to communicate with providers for non-urgent requests or questions.  Due to long hold times on the telephone, sending your provider a message by [facility] may be a faster and more efficient way to get a response.  Please allow 48 business hours for a response.  Please remember that this is for non-urgent requests.   Thank you for trusting me with your gastrointestinal care!    Thornton Park, MD, MPH

## 2021-10-18 ENCOUNTER — Encounter: Payer: Self-pay | Admitting: Gastroenterology

## 2021-10-27 ENCOUNTER — Encounter

## 2021-10-27 ENCOUNTER — Other Ambulatory Visit: Payer: Self-pay

## 2021-10-27 DIAGNOSIS — K589 Irritable bowel syndrome without diarrhea: Secondary | ICD-10-CM

## 2021-10-27 MED ORDER — PANTOPRAZOLE SODIUM 40 MG PO TBEC: 40.0000 mg | DELAYED_RELEASE_TABLET | ORAL | 0 refills | Status: DC

## 2021-10-27 MED ORDER — AMPHETAMINE-DEXTROAMPHETAMINE 10 MG TAB
10 mg | ORAL_TABLET | ORAL | 0 refills | Status: AC
Start: 2021-10-27 — End: 2021-12-03

## 2021-10-27 MED ORDER — VYVANSE 60 MG CAPSULE
60 mg | ORAL_CAPSULE | ORAL | 0 refills | Status: AC
Start: 2021-10-27 — End: 2021-12-22

## 2021-11-17 ENCOUNTER — Encounter: Attending: Internal Medicine | Primary: Internal Medicine

## 2021-11-19 ENCOUNTER — Encounter: Admit: 2021-11-19 | Attending: Internal Medicine | Primary: Internal Medicine

## 2021-11-19 DIAGNOSIS — F900 Attention-deficit hyperactivity disorder, predominantly inattentive type: Secondary | ICD-10-CM

## 2021-11-25 ENCOUNTER — Encounter: Payer: Self-pay | Admitting: Gastroenterology

## 2021-11-25 ENCOUNTER — Other Ambulatory Visit: Payer: Self-pay

## 2021-11-25 DIAGNOSIS — K589 Irritable bowel syndrome without diarrhea: Secondary | ICD-10-CM

## 2021-11-25 LAB — OPIATES CONFIRMATION, URINE: Opiates: NEGATIVE ng/mL

## 2021-11-25 MED ORDER — PANTOPRAZOLE SODIUM 40 MG PO TBEC: 40.0000 mg | DELAYED_RELEASE_TABLET | ORAL | 3 refills | Status: DC

## 2021-12-03 ENCOUNTER — Encounter

## 2021-12-03 MED ORDER — AMPHETAMINE-DEXTROAMPHETAMINE 10 MG TAB
10 mg | ORAL_TABLET | ORAL | 0 refills | Status: AC
Start: 2021-12-03 — End: ?

## 2021-12-10 ENCOUNTER — Ambulatory Visit: Payer: BC Managed Care – PPO | Admitting: Gastroenterology

## 2021-12-11 ENCOUNTER — Encounter

## 2021-12-12 ENCOUNTER — Encounter

## 2021-12-12 MED ORDER — PANTOPRAZOLE 40 MG TAB, DELAYED RELEASE
40 mg | ORAL_TABLET | Freq: Every day | ORAL | 1 refills | Status: AC
Start: 2021-12-12 — End: ?

## 2021-12-12 MED ORDER — LISDEXAMFETAMINE 60 MG CAPSULE
60 mg | ORAL_CAPSULE | Freq: Every day | ORAL | 0 refills | Status: AC
Start: 2021-12-12 — End: ?

## 2021-12-15 ENCOUNTER — Encounter: Payer: Self-pay | Admitting: Gastroenterology

## 2021-12-20 ENCOUNTER — Encounter: Payer: Self-pay | Admitting: Gastroenterology

## 2021-12-22 MED ORDER — LISDEXAMFETAMINE 60 MG CAPSULE
60 mg | ORAL_CAPSULE | Freq: Every day | ORAL | 0 refills | Status: AC
Start: 2021-12-22 — End: ?

## 2022-01-05 ENCOUNTER — Ambulatory Visit: Admit: 2022-01-05 | Attending: Internal Medicine | Primary: Internal Medicine

## 2022-01-05 ENCOUNTER — Encounter: Attending: Internal Medicine | Primary: Internal Medicine

## 2022-01-05 ENCOUNTER — Ambulatory Visit: Attending: Internal Medicine | Primary: Internal Medicine

## 2022-01-05 ENCOUNTER — Telehealth: Payer: Self-pay | Admitting: Gastroenterology

## 2022-01-05 DIAGNOSIS — J069 Acute upper respiratory infection, unspecified: Secondary | ICD-10-CM

## 2022-01-05 NOTE — Progress Notes (Signed)
Elaine Terrell is a 21 y.o. wf CC: sore throat     SUBJECTIVE:    About 2 1/2 weeks ago was off usual meds while on break from school. 13 d ago restarted  Pantoprazole  12  myalgia, arthralgia  , cough   6 sore throat   3 d ago fatigue   2 d ago T 103.7  tx Elaine Terrell /alternating every 4 hr [place] l, Advil  ;  2 nights ago sob   Yesterday sunburnn; rapid strep , rapid  COVID , rapid flu neg. At [facility]  Diarrhea , nause yesterday       Past Medical History:   Diagnosis Date    ADD (attention deficit disorder)     Arthritis     Juvenille Idiopathic Arthritis . oligo articular    History of seasonal allergies     Patellofemoral arthritis      Current Outpatient Medications   Medication Sig Dispense Refill    Lisdexamfetamine (Vyvanse) 60 mg capsule Take 1 Capsule by mouth daily. 30 Capsule 0    Lisdexamfetamine (VYVANSE) 60 mg capsule Take 1 Capsule by mouth daily. Max Daily Amount: 60 mg. 5 Capsule 0    pantoprazole (Protonix) 40 mg tablet Take 1 Tablet by mouth daily. 5 Tablet 1    dextroamphetamine-amphetamine (ADDERALL) 10 mg tablet TAKE 1 TABLET BY MOUTH ONCE A DAY 30 Tablet 0    Lisdexamfetamine (Vyvanse) 60 mg capsule TAKE ONE CAPSULE BY MOUTH EVERY DAY.Marland KitchenMAX DAILY AMOUNT 60MG  90 Capsule 0    OTHER Culturelle one daily . Beano daly      B.animalis,bifid,infantis,long (Probiotic 4X) 10-15 mg TbEC Take  by mouth.      spironolactone (ALDACTONE) 25 mg tablet Take 50 mg by mouth in the morning.      Lo Loestrin Fe 1 mg-10 mcg (24)/10 mcg (2) tab       naproxen sodium (NAPROSYN) 220 mg tablet Take 220 mg by mouth as needed (Takes as directed by the doctor). Indications: joint inflammatory disease in children and young adults       Allergies   Allergen Reactions    Accutane [Isotretinoin] Rash    Ritalin [Methylphenidate] Other (comments)     Palpitations      .  Social History     Tobacco Use   Smoking Status Never   Smokeless Tobacco Never         Review of Systems  Review of Systems - negative except as per  HPI  Her GI is MD   At [facility] [place]      Physical Examination:  Visit Vitals  BP 94/78   Pulse 97   Temp 98.3 F (36.8 C)   Ht 5' 10.5" (1.791 m)   Wt 167 lb (75.8 kg)   SpO2 94%   BMI 23.62 kg/m     Physical Exam    General: Appears in no acute distress  Ent   Throat + erythema   B/l shotty ant cerv LN   Ncek supple  Lung-clear   Heart - reg Elaine Terrell/o mgr  Abdomen -neg  Extremities -   No synovitis   Strength intact     Labs: No results found for this or any previous visit (from the past 8 hour(s)).    08-07-1986   Results for orders placed or performed in visit on 11/19/21   OPIATES CONFIRMATION, URINE   Result Value Ref Range    Opiates Negative Cutoff=100 ng/mL  Please Note Comment         Assessment/Plan    ICD-10-CM ICD-9-CM    1. Acute URI  J06.9 465.9 MONONUCLEOSIS SCREEN      CBC WITH AUTOMATED DIFF      METABOLIC PANEL, COMPREHENSIVE      PR COLLECTION VENOUS BLOOD VENIPUNCTURE          Check Mononucleosis screen   Continue sx rx prn   D/Elaine Terrell 'd mother here   Follow up to be arranged.     Author:  Ysidro Evert, MD 1:30 PM5/09/2021

## 2022-01-05 NOTE — Telephone Encounter (Signed)
SECOND ATTEMPT: ° °LVM requesting returned call. °

## 2022-01-05 NOTE — Telephone Encounter (Signed)
THIRD ATTEMPT: ? ?LVM requesting returned call. Approaching end of day and unable to reach pt. Given failed attempts and documented concern, routing this message to Dr. Orvan Falconer for continuity of care. ?

## 2022-01-05 NOTE — Telephone Encounter (Signed)
Called pt to inquire further about her symptoms. LVM requesting returned call. ?

## 2022-01-05 NOTE — Telephone Encounter (Signed)
Inbound call from patient. States she believe she is having a reaction to pantoprazole. She is experiencing white spots in the back of throat, joints aching, skin flushed, and 103 fever for last 48 hours. She have tested negative for strep, flu and covid.  ?

## 2022-01-06 LAB — COMPREHENSIVE METABOLIC PANEL
ALT: 19 IU/L (ref 0–32)
AST: 17 IU/L (ref 0–40)
Albumin/Globulin Ratio: 1.8 NA (ref 1.2–2.2)
Albumin: 4.2 g/dL (ref 3.9–5.0)
Alkaline Phosphatase: 57 IU/L (ref 42–106)
BUN: 5 mg/dL — ABNORMAL LOW (ref 6–20)
Bun/Cre Ratio: 5 NA — ABNORMAL LOW (ref 9–23)
CO2: 27 mmol/L (ref 20–29)
Calcium: 9.2 mg/dL (ref 8.7–10.2)
Chloride: 101 mmol/L (ref 96–106)
Creatinine: 0.93 mg/dL (ref 0.57–1.00)
Est, Glomerular Filtration Rate: 91 mL/min/{1.73_m2} (ref >59)
Globulin, Total: 2.4 g/dL (ref 1.5–4.5)
Glucose: 85 mg/dL (ref 70–99)
Potassium: 4.7 mmol/L (ref 3.5–5.2)
Sodium: 141 mmol/L (ref 134–144)
Total Bilirubin: <0.2 mg/dL (ref 0.0–1.2)
Total Protein: 6.6 g/dL (ref 6.0–8.5)

## 2022-01-06 LAB — CBC WITH AUTO DIFFERENTIAL
Basophils %: 1 %
Basophils Absolute: 0 10*3/uL (ref 0.0–0.2)
Eosinophils %: 0 %
Eosinophils Absolute: 0 10*3/uL (ref 0.0–0.4)
Granulocyte Absolute Count: 0 10*3/uL (ref 0.0–0.1)
Hematocrit: 39.7 % (ref 34.0–46.6)
Hemoglobin: 13.8 g/dL (ref 11.1–15.9)
Immature Granulocytes: 0 %
Lymphocytes %: 22 %
Lymphocytes Absolute: 0.9 10*3/uL (ref 0.7–3.1)
MCH: 31 pg (ref 26.6–33.0)
MCHC: 34.8 g/dL (ref 31.5–35.7)
MCV: 89 fL (ref 79–97)
Monocytes %: 7 %
Monocytes Absolute: 0.3 10*3/uL (ref 0.1–0.9)
Neutrophils %: 70 %
Neutrophils Absolute: 2.9 10*3/uL (ref 1.4–7.0)
Platelets: 229 10*3/uL (ref 150–450)
RBC: 4.45 x10E6/uL (ref 3.77–5.28)
RDW: 11.6 % — ABNORMAL LOW (ref 11.7–15.4)
WBC: 4.1 10*3/uL (ref 3.4–10.8)

## 2022-01-06 LAB — MONONUCLEOSIS SCREEN
MONONUCLEOSIS TEST, QUAL, 006189: NEGATIVE
Mononucleosis Test, Ql.: NEGATIVE

## 2022-01-06 LAB — METABOLIC PANEL, COMPREHENSIVE
A-G Ratio: 1.8 (ref 1.2–2.2)
ALT (SGPT): 19 IU/L (ref 0–32)
AST (SGOT): 17 IU/L (ref 0–40)
Albumin: 4.2 g/dL (ref 3.9–5.0)
Alk. phosphatase: 57 IU/L (ref 42–106)
BUN/Creatinine ratio: 5 — ABNORMAL LOW (ref 9–23)
BUN: 5 mg/dL — ABNORMAL LOW (ref 6–20)
Bilirubin, total: <0.2 mg/dL (ref 0.0–1.2)
CO2: 27 mmol/L (ref 20–29)
Calcium: 9.2 mg/dL (ref 8.7–10.2)
Chloride: 101 mmol/L (ref 96–106)
Creatinine: 0.93 mg/dL (ref 0.57–1.00)
GLOBULIN, TOTAL: 2.4 g/dL (ref 1.5–4.5)
Glucose: 85 mg/dL (ref 70–99)
Potassium: 4.7 mmol/L (ref 3.5–5.2)
Protein, total: 6.6 g/dL (ref 6.0–8.5)
Sodium: 141 mmol/L (ref 134–144)
eGFR: 91 mL/min/{1.73_m2} (ref >59)

## 2022-01-06 LAB — CBC WITH AUTOMATED DIFF
ABS. BASOPHILS: 0 10*3/uL (ref 0.0–0.2)
ABS. EOSINOPHILS: 0 10*3/uL (ref 0.0–0.4)
ABS. IMM. GRANS.: 0 10*3/uL (ref 0.0–0.1)
ABS. MONOCYTES: 0.3 10*3/uL (ref 0.1–0.9)
ABS. NEUTROPHILS: 2.9 10*3/uL (ref 1.4–7.0)
Abs Lymphocytes: 0.9 10*3/uL (ref 0.7–3.1)
BASOPHILS: 1 %
EOSINOPHILS: 0 %
HCT: 39.7 % (ref 34.0–46.6)
HGB: 13.8 g/dL (ref 11.1–15.9)
IMMATURE GRANULOCYTES: 0 %
Lymphocytes: 22 %
MCH: 31 pg (ref 26.6–33.0)
MCHC: 34.8 g/dL (ref 31.5–35.7)
MCV: 89 fL (ref 79–97)
MONOCYTES: 7 %
NEUTROPHILS: 70 %
PLATELET: 229 10*3/uL (ref 150–450)
RBC: 4.45 x10E6/uL (ref 3.77–5.28)
RDW: 11.6 % — ABNORMAL LOW (ref 11.7–15.4)
WBC: 4.1 10*3/uL (ref 3.4–10.8)

## 2022-01-06 NOTE — Telephone Encounter (Signed)
Telephone Encounter by Ysidro Evert, MD at 01/06/22 3646                Author: Ysidro Evert, MD  Service: --  Author Type: Physician       Filed: 01/06/22 1634  Encounter Date: 01/06/2022  Status: Signed          Editor: Ysidro Evert, MD (Physician)               Pc  pt and mother - gave lab results. Temp down to 99.4 ; throat soreness worse.  Advised start Amox from [facility]. If throat cx neg and pt continues to feel better , then stop  Amox. She has standby Diflucan prn if she gets yeast vaginitis. Call back here prn n

## 2022-01-06 NOTE — Telephone Encounter (Signed)
FINAL ATTEMPT:  LVM requesting returned call 

## 2022-01-07 ENCOUNTER — Encounter: Payer: Self-pay | Admitting: Gastroenterology

## 2022-01-07 NOTE — Telephone Encounter (Signed)
Pc last night sore throat arthralgia worse T100.9  taking amox and Tylenol , Advil prn   Mild diarrhea predating Amox . Stop Amox  Use tylenol prn . T98.6 today . D/Nandika Stetzer pt, mother. Call back here prn

## 2022-01-07 NOTE — Telephone Encounter (Signed)
Noted. Thanks.

## 2022-01-07 NOTE — Telephone Encounter (Signed)
Returned call. Mother wanted advice about how to manage fever, diarrhea and sore throat. Advised recommendations re: possible adverse reaction to Pantoprazole had been provided to pt via My Chart. Asked if pt current location is in fact [place]. Mother confirmed. Also asked if PCP is currently in [place] where pt resides. Mother again confirmed. Advised, given pt symptoms and pt location in respect to GI provider's location, it would be appropriate for pt to seek care and guidance from pt PCP. While Dr. Orvan Falconer provided pt with an alternative to Pantoprazole, pt symptoms seem to be unrelated to a GI concern and is best evaluated and treated by pt PCP. If pt is needing additional GI recommendations and/or GI advice, pt will require an appt to further evaluate and address. Mother verbalized acceptance and understanding. Routing this message to Dr. Orvan Falconer for continuity of care purposes. ?

## 2022-01-08 ENCOUNTER — Ambulatory Visit: Payer: BC Managed Care – PPO | Admitting: Gastroenterology

## 2022-01-08 NOTE — Telephone Encounter (Signed)
Ph . Throat cx neg for strep. Sore throat feels same. T98.4 . Add salt water gargles. Continue Amoxicillin . Call back prn . Spoke Derriana Oser/ mother

## 2022-01-14 ENCOUNTER — Encounter

## 2022-01-14 MED ORDER — AMPHETAMINE-DEXTROAMPHETAMINE 10 MG PO TABS
10 MG | ORAL_TABLET | ORAL | 0 refills | Status: AC
Start: 2022-01-14 — End: 2022-02-13

## 2022-01-24 ENCOUNTER — Encounter

## 2022-01-25 ENCOUNTER — Encounter

## 2022-01-25 MED ORDER — VYVANSE 60 MG PO CAPS
60 MG | ORAL_CAPSULE | ORAL | 0 refills | Status: AC
Start: 2022-01-25 — End: 2022-04-25

## 2022-01-25 MED ORDER — LISDEXAMFETAMINE DIMESYLATE 60 MG PO CAPS
60 MG | ORAL_CAPSULE | Freq: Every day | ORAL | 0 refills | Status: AC
Start: 2022-01-25 — End: 2022-02-24

## 2022-01-25 NOTE — Progress Notes (Signed)
I renewed Vyvanse today .

## 2022-02-16 ENCOUNTER — Encounter

## 2022-02-16 MED ORDER — AMPHETAMINE-DEXTROAMPHETAMINE 10 MG PO TABS
10 MG | ORAL_TABLET | Freq: Every day | ORAL | 0 refills | Status: AC
Start: 2022-02-16 — End: 2022-05-17

## 2022-02-16 NOTE — Telephone Encounter (Signed)
From: Susie Cassette  To: Dr. Governor Specking  Sent: 02/16/2022 2:10 PM EDT  Subject: 90 Day Adderall Refill    Hi Dr. Doreene Eland, I hope you're doing well! I was wondering if you could please send something to [facility] for a 90 day prescription of my 10mg  adderall booster pill so that I will be able to have it while I'm gone at camp. Thank you!

## 2022-03-10 IMAGING — CR DG TIBIA/FIBULA 2V*L*
1 series · 4 of 4 positions shown · non-contrast
Comparison: None.

CLINICAL DATA: Pain.

EXAM:
LEFT TIBIA AND FIBULA - 2 VIEW

[Series 1: dg tibia/fibula left · 0.14mm/px · 4 of 4 slices shown]
[im 1/4]
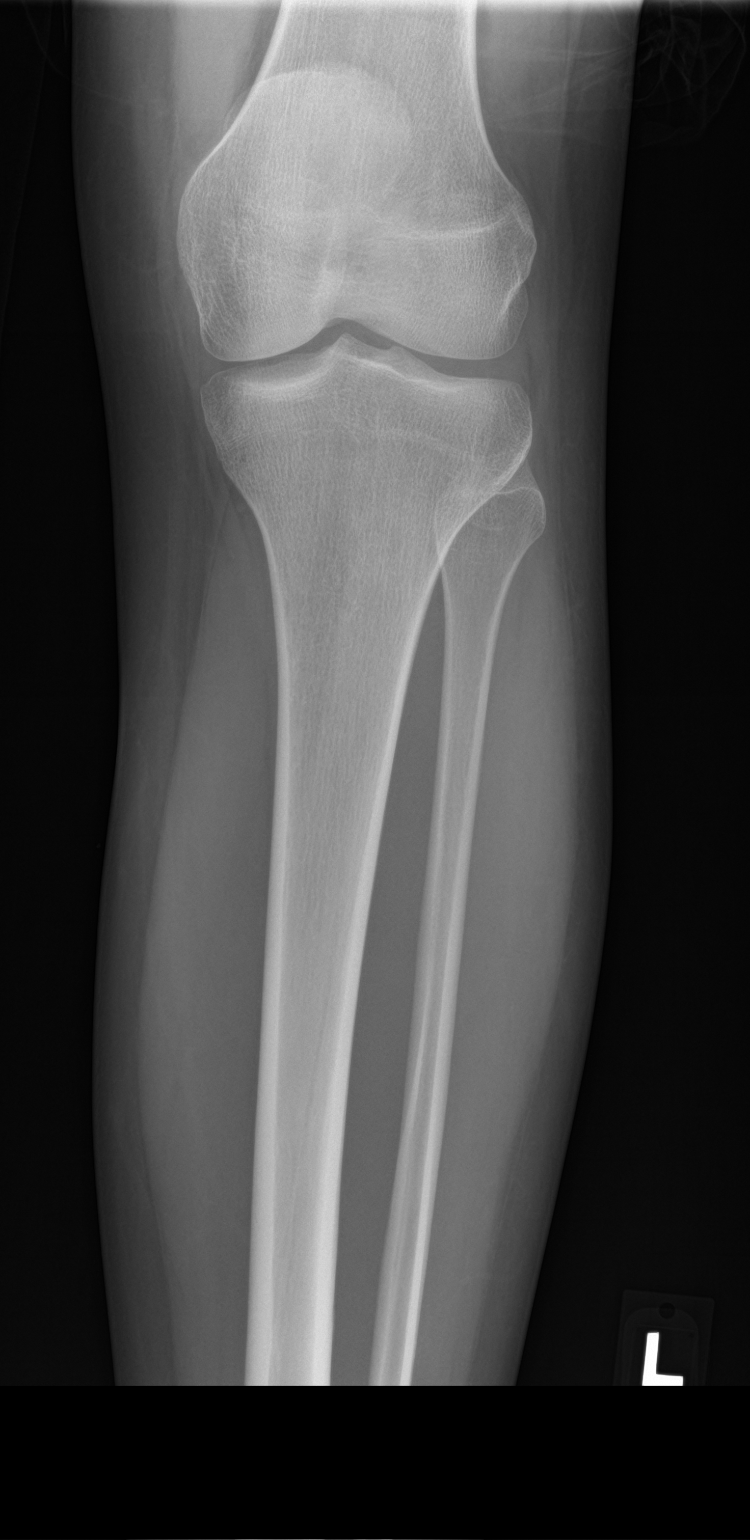
[im 2/4]
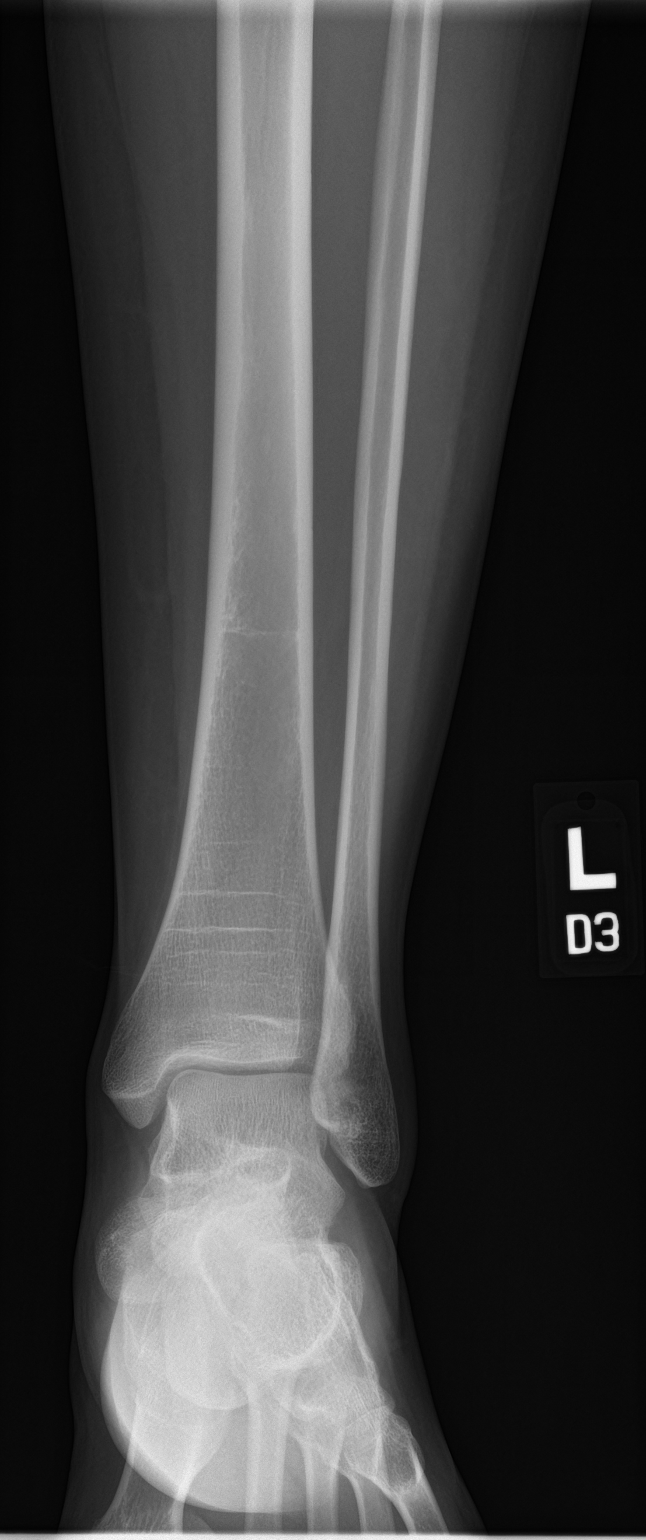
[im 3/4]
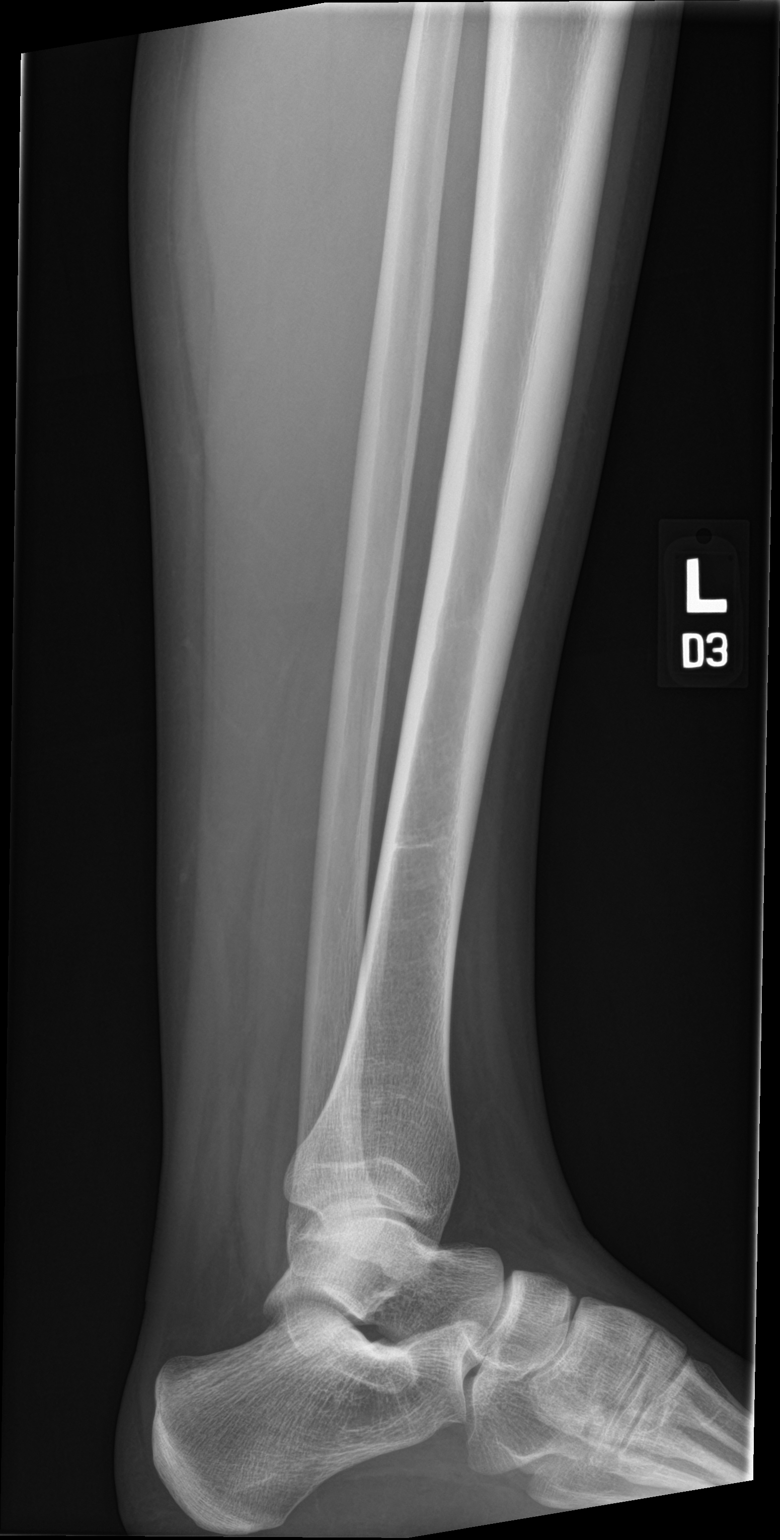
[im 4/4]
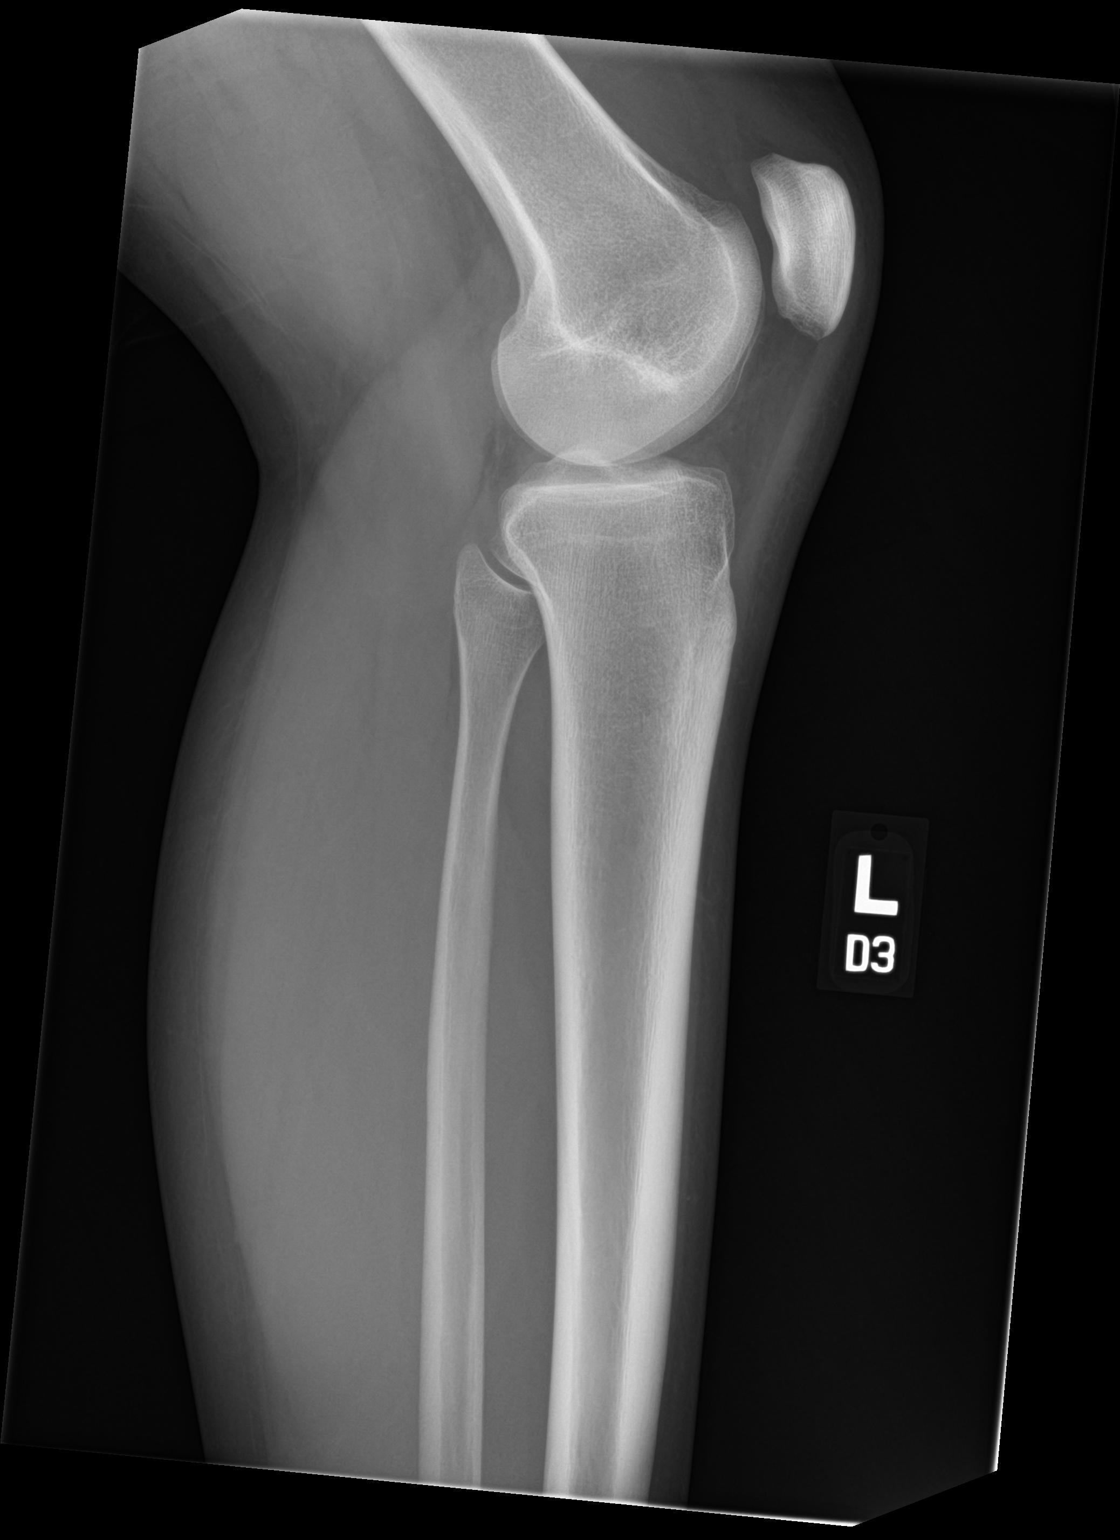

[4 of 4 positions shown; findings below may reference images not displayed]

FINDINGS: There is no evidence of fracture or other focal bone lesions. Soft
tissues are unremarkable.
IMPRESSION: Negative.

## 2022-03-17 ENCOUNTER — Encounter

## 2022-03-17 MED ORDER — AMPHETAMINE-DEXTROAMPHETAMINE 10 MG PO TABS
10 MG | ORAL_TABLET | Freq: Every day | ORAL | 0 refills | Status: AC
Start: 2022-03-17 — End: 2022-04-16

## 2022-03-17 NOTE — Telephone Encounter (Signed)
From: Susie Cassette  To: Dr. Governor Specking  Sent: 03/17/2022 12:35 PM EDT  Subject: Adderall Prescription     Hi Dr. Doreene Eland, I hope you're doing well! Could you please refill my adderall prescription so that I can have some for the remaining month of camp? I have a family friend coming to camp on Friday who can bring it to me. Thank you!!

## 2022-03-18 MED ORDER — AMPHETAMINE-DEXTROAMPHETAMINE 10 MG PO TABS
10 MG | ORAL_TABLET | Freq: Every day | ORAL | 0 refills | Status: AC
Start: 2022-03-18 — End: 2022-06-16

## 2022-05-14 ENCOUNTER — Encounter

## 2022-05-15 MED ORDER — AMPHETAMINE-DEXTROAMPHETAMINE 10 MG PO TABS
10 MG | ORAL_TABLET | ORAL | 0 refills | Status: DC
Start: 2022-05-15 — End: 2022-10-14

## 2022-05-15 MED ORDER — VYVANSE 60 MG PO CAPS
60 MG | ORAL_CAPSULE | ORAL | 0 refills | Status: AC
Start: 2022-05-15 — End: 2022-06-13

## 2022-06-09 ENCOUNTER — Encounter: Payer: Self-pay | Admitting: Oncology

## 2022-06-09 ENCOUNTER — Other Ambulatory Visit: Payer: Self-pay

## 2022-06-09 ENCOUNTER — Ambulatory Visit (INDEPENDENT_AMBULATORY_CARE_PROVIDER_SITE_OTHER): Payer: BC Managed Care – PPO | Admitting: Oncology

## 2022-06-09 VITALS — BP 112/66 | HR 108 | Temp 97.0°F | Resp 18 | Ht 67.0 in | Wt 162.0 lb

## 2022-06-09 DIAGNOSIS — J0140 Acute pansinusitis, unspecified: Secondary | ICD-10-CM | POA: Diagnosis not present

## 2022-06-09 MED ORDER — AMOXICILLIN-POT CLAVULANATE 875-125 MG PO TABS: 1.0000 | ORAL_TABLET | Freq: Two times a day (BID) | ORAL | 0 refills | Status: DC

## 2022-06-09 NOTE — Progress Notes (Signed)
[address] [place], [place] [address] Phone: [phone] Fax: [phone]   Office Visit Note  Patient Name: Alexandra Pruitt  Date of [id number]  Med Rec number [id number]  Date of Service: 06/09/2022  Isotretinoin and Methylphenidate  Chief Complaint  Patient presents with   Facial Pain   Patient is an 21 y.o. student here for complaints of sinus congestion, sinus pain and pressure, fatigue and mild sore throat since Saturday. Has had a cough at night for about 10 days. Sx have slowly worsened since then.  Denies any wheezing or shortness of breath.  Cough is worse first thing in the morning.  Notes yellow/green nasal drainage.  Has tried mucinex since Sunday evening but doesn't feel like it has helped much. No fevers.   Friends roommate has mono. Has never had mono.   Current Medication:  Outpatient Encounter Medications as of 06/09/2022  Medication Sig   amphetamine-dextroamphetamine (ADDERALL) 10 MG tablet Take 10 mg by mouth daily.   LO LOESTRIN FE 1 MG-10 MCG / 10 MCG tablet Take 1 tablet by mouth daily.   Probiotic Product (PROBIOTIC DAILY) CAPS Take by mouth.   spironolactone (ALDACTONE) 50 MG tablet Take 50 mg by mouth 2 (two) times daily.   VYVANSE 60 MG capsule Take 60 mg by mouth daily.   [DISCONTINUED] dicyclomine (BENTYL) 20 MG tablet Take 1 tablet (20 mg total) by mouth 4 (four) times daily -  before meals and at bedtime.   [DISCONTINUED] pantoprazole (PROTONIX) 40 MG tablet Take 1 tablet (40 mg total) by mouth every morning.   No facility-administered encounter medications on file as of 06/09/2022.      Medical History: Past Medical History:  Diagnosis Date   Arthritis      Vital Signs: BP 112/66   Pulse (!) 108   Temp (!) 97 F (36.1 C) (Tympanic)   Resp 18   Ht 5\' 7"  (1.702 m)   Wt 162 lb (73.5 kg)   SpO2 99%   BMI 25.37 kg/m   ROS: As per HPI.  All other pertinent ROS negative.     Review of Systems   Constitutional:  Positive for fatigue. Negative for fever.  HENT:  Positive for congestion, postnasal drip, rhinorrhea, sinus pressure, sinus pain and sore throat.   Respiratory:  Positive for cough.   Gastrointestinal:  Negative for diarrhea, nausea and vomiting.  Neurological:  Positive for headaches.    Physical Exam HENT:     Right Ear: Tympanic membrane is bulging.     Left Ear: Tympanic membrane is bulging.     Nose: Congestion and rhinorrhea present.     Right Turbinates: Swollen.     Left Turbinates: Swollen.     Right Sinus: Maxillary sinus tenderness and frontal sinus tenderness present.     Left Sinus: Maxillary sinus tenderness and frontal sinus tenderness present.     Mouth/Throat:     Pharynx: Posterior oropharyngeal erythema present.     Tonsils: No tonsillar exudate.     Comments: PND Pulmonary:     Effort: Pulmonary effort is normal.     Breath sounds: Normal breath sounds.     No results found for this or any previous visit (from the past 24 hour(s)).  Assessment/Plan: 1. Acute non-recurrent pansinusitis -Exam is concerning for pansinusitis.  Recommend Augmentin twice daily x7 days.  Provided education in clinic and via MyChart under patient education.  Recommend she continue over-the-counter Mucinex daily for congestion and can add  Flonase 2 sprays each nostril daily. -Please let me know if your symptoms do not improve or worsen.  - amoxicillin-clavulanate (AUGMENTIN) 875-125 MG tablet; Take 1 tablet by mouth 2 (two) times daily.  Dispense: 14 tablet; Refill: 0  2.  Cough -Exam is benign.  Likely secondary to postnasal drainage.  Recommend Flonase and decongestant per above.  Disposition-return to clinic if no improvement.  General Counseling: Jacolyn Reedy understanding of the findings of todays visit and agrees with plan of treatment. I have discussed any further diagnostic evaluation that may be needed or ordered today. We also reviewed her  medications today. she has been encouraged to call the office with any questions or concerns that should arise related to todays visit.   No orders of the defined types were placed in this encounter.   No orders of the defined types were placed in this encounter.   I spent 20 minutes dedicated to the care of this patient (face-to-face and non-face-to-face) on the date of the encounter to include what is described in the assessment and plan.   Durenda Hurt, NP 06/09/2022 10:34 AM

## 2022-06-13 ENCOUNTER — Encounter

## 2022-06-14 ENCOUNTER — Encounter: Payer: Self-pay | Admitting: Oncology

## 2022-06-14 MED ORDER — AMPHETAMINE-DEXTROAMPHETAMINE 10 MG PO TABS
10 MG | ORAL_TABLET | Freq: Every day | ORAL | 0 refills | Status: AC
Start: 2022-06-14 — End: 2022-09-11

## 2022-06-14 MED ORDER — VYVANSE 60 MG PO CAPS
60 MG | ORAL_CAPSULE | ORAL | 0 refills | Status: DC
Start: 2022-06-14 — End: 2022-10-14

## 2022-06-14 MED ORDER — SPIRONOLACTONE 25 MG PO TABS
25 MG | ORAL_TABLET | Freq: Every day | ORAL | 3 refills | Status: DC
Start: 2022-06-14 — End: 2023-08-02

## 2022-06-14 MED ORDER — FLUCONAZOLE 150 MG PO TABS: 150.0000 mg | ORAL_TABLET | Freq: Once | ORAL | 0 refills | Status: AC

## 2022-06-14 NOTE — Telephone Encounter (Signed)
From: Edythe Clarity  To: Dr. Reina Fuse  Sent: 06/13/2022 12:02 PM EDT  Subject: Vyvanse, adderall, and Spironolactone     Hi Dr. Wallene Huh, I hope you're doing well! I just spoke to my pharmacy about if they could increase the amount of pills for my vyvanse, adderall, and Spironolactone prescriptions and they said to contact you. Right now they have been only giving me 30 day prescriptions, but I was wondering if we could change that to 90 just so that I don't have to go to the pharmacy as much or worry about them running out when I'm home for breaks. Thank you!!

## 2022-06-18 ENCOUNTER — Encounter: Attending: Internal Medicine | Primary: Internal Medicine

## 2022-06-18 ENCOUNTER — Encounter: Admit: 2022-06-18 | Payer: PRIVATE HEALTH INSURANCE | Attending: Internal Medicine | Primary: Internal Medicine

## 2022-06-18 DIAGNOSIS — Z0000 Encounter for general adult medical examination without abnormal findings: Secondary | ICD-10-CM

## 2022-06-18 LAB — AMB POC URINALYSIS DIP STICK AUTO W/O MICRO
Bilirubin, Urine, POC: NEGATIVE
Blood, Urine, POC: NEGATIVE
Glucose, Urine, POC: NEGATIVE
Ketones, Urine, POC: NEGATIVE
Nitrite, Urine, POC: NEGATIVE
Specific Gravity, Urine, POC: 1.03 (ref 1.001–1.035)
Urobilinogen, POC: 0.2
pH, Urine, POC: 6 (ref 4.6–8.0)

## 2022-06-18 NOTE — Patient Instructions (Signed)
Call in earl  January about prescripitions needed while abroad . Give urine sample in the Spring   Next Physical in 1 year

## 2022-06-18 NOTE — Progress Notes (Signed)
Alene Bergerson is a 21 y.o.  [employer]/f  CC: Routine Physical      HPI    Routine Physical     Past Medical History:   Diagnosis Date    ADD (attention deficit disorder)     Arthritis     Juvenille Idiopathic Arthritis . oligo articular    History of seasonal allergies     Patellofemoral arthritis      No past surgical history on file.  Current Outpatient Medications   Medication Sig Dispense Refill    spironolactone (ALDACTONE) 25 MG tablet Take 2 tablets by mouth daily 180 tablet 3    VYVANSE 60 MG CAPS TAKE 1 CAPSULE BY MOUTH ONCE DAILY 90 capsule 0    amphetamine-dextroamphetamine (ADDERALL) 10 MG tablet Take 1 tablet by mouth daily for 90 days. Max Daily Amount: 1 tablet 30 tablet 0    amphetamine-dextroamphetamine (ADDERALL) 10 MG tablet TAKE 1 TABLET BY MOUTH ONCE A DAY 30 tablet 0    amphetamine-dextroamphetamine (ADDERALL, 10MG ,) 10 MG tablet Take 1 tablet by mouth daily for 90 days. Max Daily Amount: 10 mg 90 tablet 0    amphetamine-dextroamphetamine (ADDERALL, 10MG ,) 10 MG tablet Take 1 tablet by mouth daily for 30 days. Max Daily Amount: 10 mg 30 tablet 0    Lisdexamfetamine Dimesylate 60 MG CAPS Take 60 mg by mouth daily. Max Daily Amount: 60 mg      naproxen sodium (ANAPROX) 220 MG tablet Take 1 tablet by mouth as needed      norethindrone-ethinyl estradiol-Fe (LO LOESTRIN FE) 1 MG-10 MCG / 10 MCG tablet ceived the following from [facility]: Outside name: Lo Loestrin Fe 1 mg-10 mcg (24)/10 mcg (2) tab      pantoprazole (PROTONIX) 40 MG tablet Take 1 tablet by mouth daily      spironolactone (ALDACTONE) 25 MG tablet Take 2 tablets by mouth daily      Lisdexamfetamine Dimesylate 60 MG CAPS Take 60 mg by mouth daily for 30 days. Max Daily Amount: 60 mg 25 capsule 0    amphetamine-dextroamphetamine (ADDERALL) 10 MG tablet TAKE 1 TABLET BY MOUTH ONCE A DAY 30 tablet 0     No current facility-administered medications for this visit.     Allergies   Allergen Reactions    Methylphenidate Other  (See Comments)     Palpitations     Isotretinoin Rash     No family history on file.  Social History     Tobacco Use   Smoking Status Never   Smokeless Tobacco Never       Review of Systems  Denies dyspnea.; recent  URI responded Amox. Then had yeast vaginitis responding to Diflucan  Cardiac:  Denies chest pain  GI:  Intol certain food ; occasional nausea. Bowels move regularly without hematochezia.Marland Kitchen  Urination without difiucly . Joints feel ok . Mild Raynaud's  phenomenon . ADD responding to rx      Physical Examination:  BP 96/78   Temp 97 F (36.1 C)   Ht 5\' 11"  (1.803 m)   Wt 159 lb (72.1 kg)   BMI 22.18 kg/m   Physical Exam  General:   Appears in no acute distress.   HEENT:   Negative                   Lungs:   Clear        Heart:  Regular without murmur, gallop or rub  Abdomen:   Benign exam without organomegaly or mass palpable                Rectal:     Extremities: No edema cool finger ips and ttoes   2+ radial , DP pulses.    Neurologic: Grossly nonfocal    .    No results found for this or any previous visit (from the past 8 hour(s)).            No results found for this visit on 06/18/22.  Assessment/Plan   Diagnosis Orders   1. Routine general medical examination at a health care facility  AMB POC URINALYSIS DIP STICK AUTO Amiel Sharrow/O MICRO    Lipid Panel    CBC with Auto Differential    Comprehensive Metabolic Panel      2. Attention-deficit hyperactivity disorder, predominantly inattentive type  OPIATES CONFIRMATION, URINE    OPIATES CONFIRMATION, URINE        Suspected  IBS . Also to have more food allergy testing  ADD. 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 Follow   RV  6 m for  UDS   RV 1 y for CPE or prn sooner     Author:  Nadara Mode, MD 10:27 AM10/08/2022

## 2022-06-19 LAB — CBC WITH AUTO DIFFERENTIAL
Basophils %: 1 %
Basophils Absolute: 0 10*3/uL (ref 0.0–0.2)
Eosinophils %: 1 %
Eosinophils Absolute: 0.1 10*3/uL (ref 0.0–0.4)
Hematocrit: 42.3 % (ref 34.0–46.6)
Hemoglobin: 14.5 g/dL (ref 11.1–15.9)
Immature Grans (Abs): 0 10*3/uL (ref 0.0–0.1)
Immature Granulocytes: 0 %
Lymphocytes %: 41 %
Lymphocytes Absolute: 1.8 10*3/uL (ref 0.7–3.1)
MCH: 31.1 pg (ref 26.6–33.0)
MCHC: 34.3 g/dL (ref 31.5–35.7)
MCV: 91 fL (ref 79–97)
Monocytes %: 6 %
Monocytes Absolute: 0.3 10*3/uL (ref 0.1–0.9)
Neutrophils %: 51 %
Neutrophils Absolute: 2.3 10*3/uL (ref 1.4–7.0)
Platelets: 313 10*3/uL (ref 150–450)
RBC: 4.66 x10E6/uL (ref 3.77–5.28)
RDW: 11.7 % (ref 11.7–15.4)
WBC: 4.5 10*3/uL (ref 3.4–10.8)

## 2022-06-19 LAB — COMPREHENSIVE METABOLIC PANEL
ALT: 14 IU/L (ref 0–32)
AST: 18 IU/L (ref 0–40)
Albumin/Globulin Ratio: 2.1 (ref 1.2–2.2)
Albumin: 4.7 g/dL (ref 4.0–5.0)
Alkaline Phosphatase: 50 IU/L (ref 42–106)
BUN/Creatinine Ratio: 11 (ref 9–23)
BUN: 9 mg/dL (ref 6–20)
CO2: 24 mmol/L (ref 20–29)
Calcium: 9.8 mg/dL (ref 8.7–10.2)
Chloride: 100 mmol/L (ref 96–106)
Creatinine: 0.81 mg/dL (ref 0.57–1.00)
Est, Glomerular Filtration Rate: 107 mL/min/{1.73_m2} (ref >59)
Globulin, Total: 2.2 g/dL (ref 1.5–4.5)
Glucose: 92 mg/dL (ref 70–99)
Potassium: 4.8 mmol/L (ref 3.5–5.2)
Sodium: 137 mmol/L (ref 134–144)
Total Bilirubin: 0.3 mg/dL (ref 0.0–1.2)
Total Protein: 6.9 g/dL (ref 6.0–8.5)

## 2022-06-19 LAB — LIPID PANEL
Cholesterol: 263 mg/dL — ABNORMAL HIGH (ref 100–199)
HDL: 72 mg/dL (ref >39)
LDL Calculated: 173 mg/dL — ABNORMAL HIGH (ref 0–99)
Triglycerides: 104 mg/dL (ref 0–149)
VLDL Cholesterol Calculated: 18 mg/dL (ref 5–40)

## 2022-06-23 LAB — OPIATES CONFIRMATION, URINE: Opiates, Urine: NEGATIVE ng/mL

## 2022-07-09 ENCOUNTER — Encounter

## 2022-07-14 MED ORDER — METHYLPHENIDATE HCL ER (OSM) 27 MG PO TBCR
27 MG | ORAL_TABLET | Freq: Every day | ORAL | 0 refills | Status: DC
Start: 2022-07-14 — End: 2022-07-27

## 2022-07-14 NOTE — Telephone Encounter (Signed)
From: Edythe Clarity  To: Dr. Reina Fuse  Sent: 07/09/2022 10:47 AM EDT  Subject: ADHD Medication in [place]    Hi Dr. Wallene Huh,   I hope you're doing well! As I've mentioned, I'm going to [place] on February 8th to spend the semester abroad. I've been communicating with my program about my ADHD medication and found out that they do not prescribe Vyvanse or Adderall in [place]. I am not allowed to take more than 30 days of medication into the country or have it shipped, so I will need to switch to one of the allowed medications. I just met with my Health and Wellness advisor for the program, Lavella Hammock, and she thought it would be smart for me to go ahead and reach out to you to let you know this information so that I could begin trying to find a new medication that works for me that will be allowed in [place]. I'm not sure how the process of trying new medication works, but I'd like to start the process as soon as possible, so please let me know how we can begin.    Additionally, once I figure out what medication works for me, I'll also need a prescription letter from you to enter the country along with my medical records and Letter of Support to give to the psychiatrist who will prescribe me the medication in [place]. This is not an immediate concern but I wanted to let you know in advance.    I attached a PDF of the information that I was given by my program so that you could see the details and the available medications. Please let me know what I can do and how I should start the process. Thank you so much for all of your help!    Renea Ee

## 2022-07-14 NOTE — Telephone Encounter (Signed)
Pc  Clemmie Marxen/ mother per pt request. Pt going in 3 mos to [place] and there, Concerta allowed by not Vyvanse. ERX Concerta in place of Vyvanse. Follow up in office in 2 weeks or prn sooner .

## 2022-07-20 ENCOUNTER — Encounter: Payer: Self-pay | Admitting: Oncology

## 2022-07-20 ENCOUNTER — Ambulatory Visit (INDEPENDENT_AMBULATORY_CARE_PROVIDER_SITE_OTHER): Payer: BC Managed Care – PPO | Admitting: Oncology

## 2022-07-20 ENCOUNTER — Ambulatory Visit: Payer: BC Managed Care – PPO | Admitting: Nurse Practitioner

## 2022-07-20 VITALS — BP 102/64 | HR 74 | Temp 97.5°F | Resp 18 | Ht 67.0 in | Wt 165.0 lb

## 2022-07-20 DIAGNOSIS — H6503 Acute serous otitis media, bilateral: Secondary | ICD-10-CM | POA: Diagnosis not present

## 2022-07-20 MED ORDER — PREDNISONE 20 MG PO TABS: 40.0000 mg | ORAL_TABLET | Freq: Every day | ORAL | 0 refills | Status: AC

## 2022-07-20 MED ORDER — CEFDINIR 300 MG PO CAPS
300.0000 mg | ORAL_CAPSULE | Freq: Two times a day (BID) | ORAL | 0 refills | Status: AC
Start: 2022-07-20 — End: ?

## 2022-07-20 NOTE — Progress Notes (Signed)
Alexandra Pruitt, [place] Pru[place] [address] Phone: [phone] Fax: [phone]   Office Visit Note  Patient Name: Alexandra Pruitt  Date of [id number]  Med Rec number [id number]  Date of Service: 07/20/2022  Isotretinoin and Methylphenidate  No chief complaint on file.  Patient is an 21 y.o. student here for complaints of sinus pressure, sore throat and nasal congestion X 3 days. Developed ear pain (bilateral)  and headache over the weekend. Sore throat has improved some today. Feels really tired. No fevers. Has taken mucinex and tylenol sinus over the weekend. Low appetite.  Treated for sinus infection in early October with Augmentin.  Feels like the symptoms are different.  Started Concerta on Saturday. Has been on Vyvanse for almost 10 years and needed to switch medications due to traveling abroad next semester.  Wondering if some of her symptoms are due to the new medication.  Current Medication:  Outpatient Encounter Medications as of 07/20/2022  Medication Sig   amphetamine-dextroamphetamine (ADDERALL) 10 MG tablet Take 10 mg by mouth daily.   LO LOESTRIN FE 1 MG-10 MCG / 10 MCG tablet Take 1 tablet by mouth daily.   methylphenidate 27 MG PO CR tablet Take by mouth.   Probiotic Product (PROBIOTIC DAILY) CAPS Take by mouth.   spironolactone (ALDACTONE) 50 MG tablet Take 50 mg by mouth 2 (two) times daily.   amoxicillin-clavulanate (AUGMENTIN) 875-125 MG tablet Take 1 tablet by mouth 2 (two) times daily.   [DISCONTINUED] VYVANSE 60 MG capsule Take 60 mg by mouth daily.   No facility-administered encounter medications on file as of 07/20/2022.      Medical History: Past Medical History:  Diagnosis Date   Arthritis    Vital Signs: BP 102/64   Pulse 74   Temp (!) 97.5 F (36.4 C) (Tympanic)   Resp 18   Ht 5\' 7"  (1.702 m)   Wt 165 lb (74.8 kg)   SpO2 99%   BMI 25.84 kg/m   ROS: As per HPI.  All other pertinent ROS negative.     Review  of Systems  Constitutional:  Positive for fatigue.  HENT:  Positive for congestion, ear pain, rhinorrhea, sinus pressure, sinus pain and sore throat.   Respiratory:  Negative for cough.   Gastrointestinal:  Negative for diarrhea, nausea and vomiting.       Low appetite   Neurological:  Positive for headaches. Negative for dizziness.    Physical Exam HENT:     Right Ear: Tenderness present. A middle ear effusion is present. Tympanic membrane is injected and bulging.     Left Ear: Tenderness present. A middle ear effusion is present. Tympanic membrane is injected and bulging.     Nose: Congestion and rhinorrhea present. Rhinorrhea is clear.     Right Turbinates: Swollen.     Left Turbinates: Swollen.     Right Sinus: Maxillary sinus tenderness and frontal sinus tenderness present.     Left Sinus: Maxillary sinus tenderness and frontal sinus tenderness present.     Mouth/Throat:     Mouth: Mucous membranes are moist.     Pharynx: Oropharynx is clear.    No results found for this or any previous visit (from the past 24 hour(s)).  Assessment/Plan: 1. Non-recurrent acute serous otitis media of both ears -Exam consistent with bilateral otitis media.  Recommend treatment with cefdinir twice daily 300 mg x 10 days.  Discussed continuing over-the-counter Sudafed or Tylenol sinus until symptoms have improved.  Add Flonase  2 sprays each nostril daily.  Discussed trying prednisone 40 mg each morning with breakfast x4 days.  Discussed side effects of both medications and how and when to take.  - cefdinir (OMNICEF) 300 MG capsule; Take 1 capsule (300 mg total) by mouth 2 (two) times daily.  Dispense: 20 capsule; Refill: 0 - predniSONE (DELTASONE) 20 MG tablet; Take 2 tablets (40 mg total) by mouth daily with breakfast.  Dispense: 8 tablet; Refill: 0   Disposition-RTC PRN.   General Counseling: Alexandra Pruitt understanding of the findings of todays visit and agrees with plan of treatment. I  have discussed any further diagnostic evaluation that may be needed or ordered today. We also reviewed her medications today. she has been encouraged to call the office with any questions or concerns that should arise related to todays visit.  No orders of the defined types were placed in this encounter.   No orders of the defined types were placed in this encounter.  I spent 20 minutes dedicated to the care of this patient (face-to-face and non-face-to-face) on the date of the encounter to include what is described in the assessment and plan.   Durenda Hurt, NP 07/20/2022 9:24 AM

## 2022-07-27 ENCOUNTER — Ambulatory Visit: Admit: 2022-07-27 | Payer: PRIVATE HEALTH INSURANCE | Attending: Internal Medicine | Primary: Internal Medicine

## 2022-07-27 DIAGNOSIS — F900 Attention-deficit hyperactivity disorder, predominantly inattentive type: Secondary | ICD-10-CM

## 2022-07-27 MED ORDER — DEXTROAMPHETAMINE SULFATE 5 MG PO TABS
5 MG | ORAL_TABLET | Freq: Every day | ORAL | 0 refills | Status: DC
Start: 2022-07-27 — End: 2022-08-19

## 2022-07-27 MED ORDER — AMOXICILLIN 500 MG PO CAPS
500 MG | ORAL_CAPSULE | Freq: Three times a day (TID) | ORAL | 0 refills | Status: AC
Start: 2022-07-27 — End: 2022-07-31

## 2022-07-27 NOTE — Progress Notes (Signed)
Elaine Terrell is a 21 y.o. wf CC: ADHD follow up      SUBJECTIVE:    In 2 1/2 mos going for Spring semester in [place]  where Vyvanse  not allowed. She is now Off Vyvanse, Adderall. Began  Concerta starting 10 d ago ; then  b/l otititis media and sinustiits 1 wk ago  rx  elsewhere Felica Chargois/ Cefdinir  and steroids taper . C/o malaise headache,  palpitations.   Ears feel fine now. ; denies fever .       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 oligo articular    Concussion     age 64. no LOC.    History of seasonal allergies     IBS (irritable bowel syndrome)     MRSA (methicillin resistant staph aureus) culture positive     fron ingrown toenail culture in childhood.    Patellofemoral arthritis      Current Outpatient Medications   Medication Sig Dispense Refill    dextroamphetamine (DEXTROSTAT) 5 MG tablet Take 1 tablet by mouth daily for 30 days. In place of Concerta Max Daily Amount: 5 mg 30 tablet 0    amoxicillin (AMOXIL) 500 MG capsule Take 1 capsule by mouth 3 times daily for 4 days 12 capsule 0    spironolactone (ALDACTONE) 25 MG tablet Take 2 tablets by mouth daily 180 tablet 3    VYVANSE 60 MG CAPS TAKE 1 CAPSULE BY MOUTH ONCE DAILY 90 capsule 0    amphetamine-dextroamphetamine (ADDERALL) 10 MG tablet Take 1 tablet by mouth daily for 90 days. Max Daily Amount: 1 tablet 30 tablet 0    amphetamine-dextroamphetamine (ADDERALL) 10 MG tablet TAKE 1 TABLET BY MOUTH ONCE A DAY 30 tablet 0    amphetamine-dextroamphetamine (ADDERALL, 10MG ,) 10 MG tablet Take 1 tablet by mouth daily for 90 days. Max Daily Amount: 10 mg 90 tablet 0    amphetamine-dextroamphetamine (ADDERALL, 10MG ,) 10 MG tablet Take 1 tablet by mouth daily for 30 days. Max Daily Amount: 10 mg 30 tablet 0    Lisdexamfetamine Dimesylate 60 MG CAPS Take 60 mg by mouth daily. Max Daily Amount: 60 mg      naproxen sodium (ANAPROX) 220 MG tablet Take 1  tablet by mouth as needed      norethindrone-ethinyl estradiol-Fe (LO LOESTRIN FE) 1 MG-10 MCG / 10 MCG tablet ceived the following from [facility]: Outside name: Lo Loestrin Fe 1 mg-10 mcg (24)/10 mcg (2) tab      pantoprazole (PROTONIX) 40 MG tablet Take 1 tablet by mouth daily      spironolactone (ALDACTONE) 25 MG tablet Take 2 tablets by mouth daily      Lisdexamfetamine Dimesylate 60 MG CAPS Take 60 mg by mouth daily for 30 days. Max Daily Amount: 60 mg 25 capsule 0    amphetamine-dextroamphetamine (ADDERALL) 10 MG tablet TAKE 1 TABLET BY MOUTH ONCE A DAY 30 tablet 0     No current facility-administered medications for this visit.     Allergies   Allergen Reactions    Isotretinoin Rash     Social History     Tobacco Use   Smoking Status Never   Smokeless Tobacco Never       Denies cough   Review of Systems  Review of Systems - negative except as per  HPI      Physical Examination:  BP 102/82   Temp 97 F (36.1 C)   Ht 1.803 m (5\' 11" )   Wt 76.2 kg (168 lb)   BMI 23.43 kg/m   Physical Exam    General: Appears in no acute distress    ENT: R TM clear ; L tm mild pink   Throat clear   Neck supple  Lung-clear   Heart - reg Braelynne Garinger/o mgr   Abdomen -bs+ soft, nt  Extremities - no pedal edema     Labs: No results found for this or any previous visit (from the past 8 hour(s)).    No results found for this visit on 07/27/22.     Assessment/Plan   Diagnosis Orders   1. Attention-deficit hyperactivity disorder, predominantly inattentive type  Opiates Confirmation, Urine    dextroamphetamine (DEXTROSTAT) 5 MG tablet      2. Acute upper respiratory infection          Stop Concerta. Start Dexedrine 5 mg qd   URI stop Cefdinir. ERX Amox    Follow up in 4 d by email  or  sooner PRN.    Author:  07/29/22, MD 9:45 PM11/20/2023

## 2022-07-27 NOTE — Patient Instructions (Signed)
Stop Concerta and Cefdinir   Start Dexedrine 5 mg each morning   Start Amoxicillin today   Email back with progress in 4 days

## 2022-08-02 LAB — OPIATES CONFIRMATION, URINE: Opiates, Urine: NEGATIVE ng/mL

## 2022-08-02 LAB — SPECIMEN STATUS REPORT

## 2022-08-18 ENCOUNTER — Encounter

## 2022-08-19 MED ORDER — DEXTROAMPHETAMINE SULFATE 10 MG PO TABS
10 | ORAL_TABLET | Freq: Every day | ORAL | 0 refills | Status: DC
Start: 2022-08-19 — End: 2024-08-10

## 2022-08-19 NOTE — Telephone Encounter (Signed)
From: Edythe Clarity  To: Dr. Reina Fuse  Sent: 08/18/2022 1:03 PM EST  Subject: Dextrostat 5mg     Hi Dr. Wallene Huh,  I hope you're doing well. The Dextrostat 5mg  prescription has been working well, but I think it may be helpful to try a bit higher of a dosage because I'm still not feeling quite as good as I was on the Vyvanse. Would it be possible for me to try a bit higher dosage next time I get my prescription refilled? I have 8 more pills left currently. Thank you!

## 2022-09-10 NOTE — Telephone Encounter (Signed)
See her email.

## 2022-09-18 ENCOUNTER — Encounter

## 2022-09-19 MED ORDER — DEXTROAMPHETAMINE SULFATE 10 MG PO TABS
10 | ORAL_TABLET | Freq: Every day | ORAL | 0 refills | Status: DC
Start: 2022-09-19 — End: 2024-08-10

## 2022-09-19 NOTE — Telephone Encounter (Signed)
From: Edythe Clarity  To: Dr. Reina Fuse  Sent: 09/18/2022 10:18 AM EST  Subject: Dextrostat before leaving country     Hi Dr. Wallene Huh!  I appreciate all your help with getting ready for my trip to [place]. I've been advised to take a full 30 day prescription of my Dextrostat with me, and was wondering if/how we could make that work. I currently have 7 more pills left, so will need to pick up my new prescription before the 19th of January. Would it be possible for this new prescription to only have 20 pills in it so that I would run out on February 8th? That way I could go to the pharmacy on February 5th or 6th to grab the 30 days of new medicine before my trip.     Additionally, I was wondering if you could please write a letter that provides a description of the medical need, dosage, and generic name(s) for my medication. This letter should also indicate that the medication is medically necessary and for my personal use. Along with the prescription letter, I will also need a Letter of Support from you as my prescribing physician which outlines that I am your patient, how long I have been working with you, my diagnosis, medication, and the dosage.    I will also need medical records from any doctor who has seen or treated me for ADHD, including my initial evaluation paperwork, and was wondering how I could get access to this information.     I'm so sorry that there is so much information, and would happy to speak with you over the phone if that would be easier. I've attached the ADHD and prescription guides given to me by my program for extra information.     Thank you again!!  Renea Ee

## 2022-09-25 NOTE — Progress Notes (Signed)
Chart note. Patient recently notes her  ADHD is responding well to Dextroamphetamine 10 mg by mouth daily. I advise she continue this. I prescribe this for her. It is medically necessary .

## 2022-10-08 ENCOUNTER — Encounter

## 2022-10-08 MED ORDER — DEXTROAMPHETAMINE SULFATE 10 MG PO TABS
10 | ORAL_TABLET | Freq: Every day | ORAL | 0 refills | Status: DC
Start: 2022-10-08 — End: 2024-08-10

## 2022-10-08 NOTE — Telephone Encounter (Signed)
I renewed Dextrostat

## 2022-10-09 ENCOUNTER — Ambulatory Visit: Admit: 2022-10-09 | Payer: PRIVATE HEALTH INSURANCE | Attending: Internal Medicine | Primary: Internal Medicine

## 2022-10-09 DIAGNOSIS — F900 Attention-deficit hyperactivity disorder, predominantly inattentive type: Secondary | ICD-10-CM

## 2022-10-14 ENCOUNTER — Ambulatory Visit: Admit: 2022-10-14 | Payer: PRIVATE HEALTH INSURANCE | Attending: Internal Medicine | Primary: Internal Medicine

## 2022-10-14 DIAGNOSIS — J101 Influenza due to other identified influenza virus with other respiratory manifestations: Secondary | ICD-10-CM

## 2022-10-14 LAB — OPIATES CONFIRMATION, URINE: Opiates, Urine: NEGATIVE ng/mL

## 2022-10-14 LAB — AMB POC SARS-COV-2 AND INFLUENZA A/B
Influenza A Antigen, POC: POSITIVE
Influenza B Antigen, POC: NEGATIVE
SARS-COV-2 RNA, POC: NEGATIVE

## 2022-10-14 MED ORDER — METHYLPREDNISOLONE 4 MG PO TBPK
4 | PACK | ORAL | 0 refills | Status: AC
Start: 2022-10-14 — End: 2022-10-20

## 2022-10-14 MED ORDER — OSELTAMIVIR PHOSPHATE 75 MG PO CAPS
75 | ORAL_CAPSULE | Freq: Two times a day (BID) | ORAL | 0 refills | Status: AC
Start: 2022-10-14 — End: 2022-10-19

## 2022-10-14 NOTE — Telephone Encounter (Signed)
Pt would like would like to speak with the doctor to discuss what medication the pt can not take with the two medications that were prescribed today     Ph# [phone]

## 2022-10-14 NOTE — Progress Notes (Signed)
Elaine Terrell is a 22 y.o. wf CC: sore throat    SUBJECTIVE:    Last night sore throat; sneezing runny nose, myalgia, nasal and ear congestion . 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 oligo articular    Concussion     age 89. no LOC.    History of seasonal allergies     IBS (irritable bowel syndrome)     MRSA (methicillin resistant staph aureus) culture positive     fron ingrown toenail culture in childhood.    Patellofemoral arthritis      Current Outpatient Medications   Medication Sig Dispense Refill    oseltamivir (TAMIFLU) 75 MG capsule Take 1 capsule by mouth 2 times daily for 5 days 10 capsule 0    methylPREDNISolone (MEDROL DOSEPACK) 4 MG tablet Take by mouth as directed with food 1 kit 0    dextroamphetamine (DEXTROSTAT) 10 MG tablet Take 1 tablet by mouth daily for 30 days. Max Daily Amount: 10 mg 30 tablet 0    dextroamphetamine (DEXTROSTAT) 10 MG tablet Take 1 tablet by mouth daily for 20 days. Max Daily Amount: 10 mg 20 tablet 0    dextroamphetamine (DEXTROSTAT) 10 MG tablet Take 1 tablet by mouth daily for 30 days. Max Daily Amount: 10 mg 30 tablet 0    spironolactone (ALDACTONE) 25 MG tablet Take 2 tablets by mouth daily 180 tablet 3    norethindrone-ethinyl estradiol-Fe (LO LOESTRIN FE) 1 MG-10 MCG / 10 MCG tablet ceived the following from [facility]: Outside name: Lo Loestrin Fe 1 mg-10 mcg (24)/10 mcg (2) tab      spironolactone (ALDACTONE) 25 MG tablet Take 2 tablets by mouth daily       No current facility-administered medications for this visit.     Allergies   Allergen Reactions    Isotretinoin Rash     Social History     Tobacco Use   Smoking Status Never   Smokeless Tobacco Never         Review of Systems  Review of Systems - negative except as per HPI      Physical Examination:  BP 110/80   Temp 98.6 F (37 C)   Ht 1.803 m (5\' 11" )    Wt 83 kg (183 lb)   BMI 25.52 kg/m   Physical Exam    General: Appears in no acute distress    ENT PERRLA   Tms clear b/l   Throat : slight erythema;  B/l soft ant cervical mobile LN felt  Neck supple     Lung-clear  Heart - reeg  Abdomen -bs+ soft, nt  Extremities -     Labs:   No results found for this or any previous visit (from the past 8 hour(s)).      Results for orders placed or performed in visit on 10/14/22   AMB POC SARS-COV-2 AND INFLUENZA A/B   Result Value Ref Range    SARS-COV-2 RNA, POC Negative     Influenza A Antigen, POC Positive     Influenza B Antigen, POC Negative         Assessment/Plan   Diagnosis Orders   1. Influenza A        2. Acute cough  AMB POC SARS-COV-2 AND  INFLUENZA A/B          ERX Tamiflu.   ERX Medrol pack . PMH marked  nasal congestion Lyvonne Cassell/ URI's.   Use saline nasal rinse.   Follow up PRN.    Author:  Skip Estimable, MD 8:24 PM2/03/2023

## 2022-10-14 NOTE — Patient Instructions (Signed)
Tamiflu . Medrol dose pack . Take each with food  While on Medrol dose pack no Ibuprofen or Aleve, but Tylenol  is ok   Benadryl or Dramamine is ok   Wear mask at home today and on plane to [place]  Call  back as needed

## 2022-10-15 NOTE — Telephone Encounter (Signed)
Pc b/l ear congestion after plane flight, despite Afrin. Advised Pseudofed prn . Call back prn. Spoke Elaine Terrell/ grand father also

## 2022-12-27 NOTE — Progress Notes (Signed)
Lab only 

## 2023-02-13 ENCOUNTER — Encounter

## 2023-02-14 NOTE — Telephone Encounter (Signed)
From: Elaine Terrell  To: Dr. Governor Specking  Sent: 02/13/2023 10:23 PM EDT  Subject: Vyvanse Prescription    Hi Dr. Doreene Eland,  I hope you are doing well! I'm going to be back from [place] in 2 weeks and need to get my Vyvanse and Adderall prescriptions refilled before then so that my mom can pick them up for me since I'll only be in [place] for 1 day. This summer I'll be at my camp again for 2 months, so it would be great to get a 90 day prescription of both of those medications. Please let me know if there's any problems with this. Thank you!

## 2023-02-15 MED ORDER — AMPHETAMINE-DEXTROAMPHETAMINE 10 MG PO TABS
10 MG | ORAL_TABLET | Freq: Every day | ORAL | 0 refills | Status: AC
Start: 2023-02-15 — End: 2023-05-15

## 2023-02-15 MED ORDER — LISDEXAMFETAMINE DIMESYLATE 60 MG PO CAPS
60 | ORAL_CAPSULE | Freq: Every day | ORAL | 0 refills | Status: AC
Start: 2023-02-15 — End: 2023-05-15

## 2023-05-19 ENCOUNTER — Encounter

## 2023-05-19 MED ORDER — AMPHETAMINE-DEXTROAMPHETAMINE 10 MG PO TABS
10 | ORAL_TABLET | Freq: Every day | ORAL | 0 refills | Status: DC
Start: 2023-05-19 — End: 2024-06-19

## 2023-05-19 MED ORDER — AMPHETAMINE-DEXTROAMPHETAMINE 10 MG PO TABS
10 MG | ORAL_TABLET | Freq: Every day | ORAL | 0 refills | Status: DC
Start: 2023-05-19 — End: 2023-12-16

## 2023-05-19 MED ORDER — LISDEXAMFETAMINE DIMESYLATE 60 MG PO CAPS
60 | ORAL_CAPSULE | Freq: Every day | ORAL | 0 refills | Status: DC
Start: 2023-05-19 — End: 2023-09-20

## 2023-05-19 MED ORDER — LISDEXAMFETAMINE DIMESYLATE 60 MG PO CAPS
60 MG | ORAL_CAPSULE | Freq: Every day | ORAL | 0 refills | Status: DC
Start: 2023-05-19 — End: 2024-01-19

## 2023-08-02 ENCOUNTER — Encounter: Admit: 2023-08-02 | Payer: PRIVATE HEALTH INSURANCE | Admitting: Internal Medicine | Primary: Internal Medicine

## 2023-08-02 VITALS — BP 110/78 | HR 88 | Temp 97.30000°F | Ht 70.25 in | Wt 178.0 lb

## 2023-08-02 DIAGNOSIS — Z0000 Encounter for general adult medical examination without abnormal findings: Secondary | ICD-10-CM

## 2023-08-02 LAB — AMB POC URINALYSIS DIP STICK AUTO W/O MICRO
Bilirubin, Urine, POC: NEGATIVE
Blood, Urine, POC: NEGATIVE
Glucose, Urine, POC: NEGATIVE
Ketones, Urine, POC: NEGATIVE
Nitrite, Urine, POC: NEGATIVE
Protein, Urine, POC: NEGATIVE
Specific Gravity, Urine, POC: 1.025 (ref 1.001–1.035)
Urobilinogen, POC: 0.2
pH, Urine, POC: 7.5 (ref 4.6–8.0)

## 2023-08-02 LAB — AMB POC SARS-COV-2 AND INFLUENZA A/B
Influenza A Antigen, POC: NEGATIVE
Influenza B Antigen, POC: NEGATIVE
SARS-COV-2 RNA, POC: NEGATIVE

## 2023-08-02 NOTE — Patient Instructions (Signed)
Same program  Return in  1 year or sooner as needed

## 2023-08-02 NOTE — Progress Notes (Signed)
 Elaine Terrell is a 22 y.o. 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 ol

## 2023-08-03 LAB — CBC WITH AUTO DIFFERENTIAL
Basophils %: 1 %
Basophils Absolute: 0.1 10*3/uL (ref 0.0–0.2)
Eosinophils %: 2 %
Eosinophils Absolute: 0.1 10*3/uL (ref 0.0–0.4)
Hematocrit: 40.9 % (ref 34.0–46.6)
Hemoglobin: 13.9 g/dL (ref 11.1–15.9)
Immature Grans (Abs): 0 10*3/uL (ref 0.0–0.1)
Immature Granulocytes %: 0 %
Lymphocytes %: 38 %
Lymphocytes Absolute: 1.7 10*3/uL (ref 0.7–3.1)
MCH: 31.3 pg (ref 26.6–33.0)
MCHC: 34 g/dL (ref 31.5–35.7)
MCV: 92 fL (ref 79–97)
Monocytes %: 7 %
Monocytes Absolute: 0.3 10*3/uL (ref 0.1–0.9)
Neutrophils %: 52 %
Neutrophils Absolute: 2.3 10*3/uL (ref 1.4–7.0)
Platelets: 296 10*3/uL (ref 150–450)
RBC: 4.44 x10E6/uL (ref 3.77–5.28)
RDW: 11.6 % — ABNORMAL LOW (ref 11.7–15.4)
WBC: 4.4 10*3/uL (ref 3.4–10.8)

## 2023-08-03 LAB — COMPREHENSIVE METABOLIC PANEL
ALT: 15 [IU]/L (ref 0–32)
AST: 21 [IU]/L (ref 0–40)
Albumin: 4.2 g/dL (ref 4.0–5.0)
Alkaline Phosphatase: 65 [IU]/L (ref 44–121)
BUN/Creatinine Ratio: 15 (ref 9–23)
BUN: 13 mg/dL (ref 6–20)
CO2: 23 mmol/L (ref 20–29)
Calcium: 9.8 mg/dL (ref 8.7–10.2)
Chloride: 100 mmol/L (ref 96–106)
Creatinine: 0.86 mg/dL (ref 0.57–1.00)
Est, Glom Filt Rate: 99 mL/min/{1.73_m2} (ref >59)
Globulin, Total: 2.5 g/dL (ref 1.5–4.5)
Glucose: 85 mg/dL (ref 70–99)
Potassium: 4.7 mmol/L (ref 3.5–5.2)
Sodium: 139 mmol/L (ref 134–144)
Total Bilirubin: 0.3 mg/dL (ref 0.0–1.2)
Total Protein: 6.7 g/dL (ref 6.0–8.5)

## 2023-08-03 LAB — LIPID PANEL
Cholesterol, Total: 246 mg/dL — ABNORMAL HIGH (ref 100–199)
HDL: 71 mg/dL (ref >39)
LDL Cholesterol: 162 mg/dL — ABNORMAL HIGH (ref 0–99)
Triglycerides: 79 mg/dL (ref 0–149)
VLDL Cholesterol Calculated: 13 mg/dL (ref 5–40)

## 2023-08-03 NOTE — Telephone Encounter (Signed)
 Pc gave results to pt ; continue same program. Call back here prn

## 2023-08-11 LAB — OPIATES CONFIRMATION, URINE: Opiates, Urine: NEGATIVE ng/mL

## 2023-08-21 ENCOUNTER — Encounter

## 2023-08-21 MED ORDER — LISDEXAMFETAMINE DIMESYLATE 60 MG PO CAPS
60 | ORAL_CAPSULE | ORAL | 0 refills | Status: AC
Start: 2023-08-21 — End: 2023-11-19

## 2023-08-23 ENCOUNTER — Encounter: Admit: 2023-08-23 | Admitting: Internal Medicine

## 2023-08-23 DIAGNOSIS — F900 Attention-deficit hyperactivity disorder, predominantly inattentive type: Secondary | ICD-10-CM

## 2023-08-23 MED ORDER — LISDEXAMFETAMINE DIMESYLATE 60 MG PO CAPS
60 | ORAL_CAPSULE | Freq: Every day | ORAL | 0 refills | Status: DC
Start: 2023-08-23 — End: 2024-08-10

## 2023-08-23 MED ORDER — LISDEXAMFETAMINE DIMESYLATE 60 MG PO CAPS
60 MG | ORAL_CAPSULE | Freq: Every day | ORAL | 0 refills | Status: DC
Start: 2023-08-23 — End: 2024-02-14

## 2023-09-20 ENCOUNTER — Encounter

## 2023-09-20 MED ORDER — LISDEXAMFETAMINE DIMESYLATE 60 MG PO CAPS
60 | ORAL_CAPSULE | Freq: Every day | ORAL | 0 refills | Status: AC
Start: 2023-09-20 — End: 2023-11-19

## 2023-11-19 ENCOUNTER — Encounter

## 2023-11-19 MED ORDER — LISDEXAMFETAMINE DIMESYLATE 60 MG PO CAPS
60 | ORAL_CAPSULE | ORAL | 0 refills | Status: DC
Start: 2023-11-19 — End: 2023-11-22

## 2023-11-22 ENCOUNTER — Encounter

## 2023-11-22 MED ORDER — LISDEXAMFETAMINE DIMESYLATE 60 MG PO CAPS
60 | ORAL_CAPSULE | Freq: Every day | ORAL | 0 refills | Status: DC
Start: 2023-11-22 — End: 2024-08-10

## 2023-11-25 MED ORDER — LISDEXAMFETAMINE DIMESYLATE 60 MG PO CAPS
60 | ORAL_CAPSULE | Freq: Every day | ORAL | 0 refills | Status: DC
Start: 2023-11-25 — End: 2024-08-10

## 2023-12-16 ENCOUNTER — Encounter

## 2023-12-16 MED ORDER — AMPHETAMINE-DEXTROAMPHETAMINE 10 MG PO TABS
10 MG | ORAL_TABLET | ORAL | 0 refills | Status: DC
Start: 2023-12-16 — End: 2024-02-14

## 2023-12-23 ENCOUNTER — Ambulatory Visit (INDEPENDENT_AMBULATORY_CARE_PROVIDER_SITE_OTHER): Admitting: Physician Assistant

## 2023-12-23 ENCOUNTER — Encounter: Payer: Self-pay | Admitting: Physician Assistant

## 2023-12-23 VITALS — HR 88 | Temp 99.3°F | Ht 70.0 in | Wt 172.0 lb

## 2023-12-23 DIAGNOSIS — J029 Acute pharyngitis, unspecified: Secondary | ICD-10-CM | POA: Diagnosis not present

## 2023-12-23 LAB — POCT RAPID STREP A (OFFICE): Rapid Strep A Screen: NEGATIVE

## 2023-12-23 NOTE — Progress Notes (Signed)
 [facility] [address] [place], [place] [address] Phone: [phone] Fax: [phone]   Office Visit Note  Patient Name: Alexandra Pruitt  Date of [id number]  Med Rec number [id number]   Isotretinoin and Methylphenidate  Chief Complaint  Patient presents with   Acute Visit   23 year old female with concern for strep  When went to bed, throat hurt  Woke her up in the middle of the night Now has a stuffy nose  Around a bunch of kids   Does have allergies - takes Flonase   Took Advil this morning at 7:30AM-8AM  Never has had mono before and does not get strep   Concerned, because she is going home to grandparents  Current Medication:  Outpatient Encounter Medications as of 12/23/2023  Medication Sig   amphetamine-dextroamphetamine (ADDERALL) 10 MG tablet Take 10 mg by mouth daily.   lisdexamfetamine (VYVANSE) 60 MG capsule Take 60 mg by mouth every morning.   LO LOESTRIN FE 1 MG-10 MCG / 10 MCG tablet Take 1 tablet by mouth daily.   spironolactone (ALDACTONE) 50 MG tablet Take 50 mg by mouth 2 (two) times daily.   cefdinir (OMNICEF) 300 MG capsule Take 1 capsule (300 mg total) by mouth 2 (two) times daily. (Patient not taking: Reported on 12/23/2023)   methylphenidate 27 MG PO CR tablet Take by mouth.   predniSONE (DELTASONE) 20 MG tablet Take 2 tablets (40 mg total) by mouth daily with breakfast. (Patient not taking: Reported on 12/23/2023)   Probiotic Product (PROBIOTIC DAILY) CAPS Take by mouth. (Patient not taking: Reported on 12/23/2023)   No facility-administered encounter medications on file as of 12/23/2023.      Medical History: Past Medical History:  Diagnosis Date   Arthritis      Vital Signs: Pulse 88   Temp 99.3 F (37.4 C) (Tympanic)   Ht 5\' 10"  (1.778 m)   Wt 172 lb (78 kg)   SpO2 99%   BMI 24.68 kg/m    ROS negative unless otherwise indicated above.  Physical Exam Vitals reviewed.  HENT:     Right Ear: Ear canal  and external ear normal. No laceration, drainage, swelling or tenderness. There is no impacted cerumen. No foreign body. Tympanic membrane is not injected, perforated or erythematous.     Left Ear: Ear canal and external ear normal. No laceration, drainage, swelling or tenderness. There is no impacted cerumen. No foreign body. Tympanic membrane is not injected, perforated or erythematous.     Nose: Nose normal. No mucosal edema, congestion or rhinorrhea.     Right Nostril: No epistaxis.     Left Nostril: No epistaxis.     Right Turbinates: Not enlarged or swollen.     Left Turbinates: Not enlarged or swollen.     Right Sinus: No maxillary sinus tenderness or frontal sinus tenderness.     Left Sinus: No maxillary sinus tenderness or frontal sinus tenderness.     Mouth/Throat:     Pharynx: Posterior oropharyngeal erythema present. No oropharyngeal exudate.     Tonsils: No tonsillar exudate or tonsillar abscesses.  Eyes:     Pupils: Pupils are equal, round, and reactive to light.  Cardiovascular:     Rate and Rhythm: Normal rate and regular rhythm.     Heart sounds: Normal heart sounds. No murmur heard.    No friction rub.  Pulmonary:     Effort: Pulmonary effort is normal. No respiratory distress.     Breath sounds: No  stridor. No wheezing, rhonchi or rales.  Musculoskeletal:     Cervical back: No tenderness.  Lymphadenopathy:     Cervical: Cervical adenopathy present.  Neurological:     Mental Status: She is alert.  Psychiatric:        Behavior: Behavior normal.     Results for orders placed or performed in visit on 12/23/23 (from the past 24 hours)  POCT rapid strep A     Status: Normal   Collection Time: 12/23/23  4:23 PM  Result Value Ref Range   Rapid Strep A Screen Negative Negative     Assessment/Plan:  1. Sore throat - POCT rapid strep A - CBC w/Diff - EPSTEIN-BARR VIRUS (EBV) Antibody Profile  2. Pharyngitis, unspecified etiology (Primary)   Going home to see  her grandparents so interested in getting tested for strep, as well as to see her blood counts / getting CBC/EBV   General Counseling: Elijio Guadeloupe understanding of the findings of todays visit and agrees with plan of treatment.   she has been encouraged to call the office with any questions or concerns that should arise related to todays visit.  Total time spent with patient today 20 minutes. This includes reviewing records, evaluating the patient, and coordinating care. Face-to-face time >50%.    Orders Placed This Encounter  Procedures   CBC w/Diff   EPSTEIN-BARR VIRUS (EBV) Antibody Profile   POCT rapid strep A    No orders of the defined types were placed in this encounter.     Signed, Shamyra Farias D Onetta Spainhower, PA-C 12/23/2023, 5:18 PM

## 2023-12-24 ENCOUNTER — Ambulatory Visit: Admitting: Medical

## 2023-12-25 LAB — CBC WITH DIFFERENTIAL/PLATELET
Basophils Absolute: 0.1 10*3/uL (ref 0.0–0.2)
Basos: 1 %
EOS (ABSOLUTE): 0.2 10*3/uL (ref 0.0–0.4)
Eos: 2 %
Hematocrit: 40.1 % (ref 34.0–46.6)
Hemoglobin: 13.8 g/dL (ref 11.1–15.9)
Immature Grans (Abs): 0 10*3/uL (ref 0.0–0.1)
Immature Granulocytes: 0 %
Lymphocytes Absolute: 1.8 10*3/uL (ref 0.7–3.1)
Lymphs: 21 %
MCH: 31.6 pg (ref 26.6–33.0)
MCHC: 34.4 g/dL (ref 31.5–35.7)
MCV: 92 fL (ref 79–97)
Monocytes Absolute: 0.4 10*3/uL (ref 0.1–0.9)
Monocytes: 5 %
Neutrophils Absolute: 6.2 10*3/uL (ref 1.4–7.0)
Neutrophils: 71 %
Platelets: 294 10*3/uL (ref 150–450)
RBC: 4.37 x10E6/uL (ref 3.77–5.28)
RDW: 11.4 % — ABNORMAL LOW (ref 11.7–15.4)
WBC: 8.7 10*3/uL (ref 3.4–10.8)

## 2023-12-25 LAB — EPSTEIN-BARR VIRUS (EBV) ANTIBODY PROFILE
EBV NA IgG: <18 U/mL (ref 0.0–17.9)
EBV VCA IgG: <18 U/mL (ref 0.0–17.9)
EBV VCA IgM: <36 U/mL (ref 0.0–35.9)

## 2023-12-27 ENCOUNTER — Encounter: Payer: Self-pay | Admitting: Physician Assistant

## 2024-01-17 ENCOUNTER — Encounter

## 2024-01-19 MED ORDER — LISDEXAMFETAMINE DIMESYLATE 60 MG PO CAPS
60 | ORAL_CAPSULE | Freq: Every day | ORAL | 0 refills | 30.00000 days | Status: DC
Start: 2024-01-19 — End: 2024-08-10

## 2024-02-14 ENCOUNTER — Encounter

## 2024-02-14 MED ORDER — LISDEXAMFETAMINE DIMESYLATE 60 MG PO CAPS
60 | ORAL_CAPSULE | Freq: Every day | ORAL | 0 refills | 30.00000 days | Status: DC
Start: 2024-02-14 — End: 2024-06-19

## 2024-02-14 MED ORDER — FLUCONAZOLE 150 MG PO TABS
150 | ORAL_TABLET | Freq: Once | ORAL | 0 refills | 6.00000 days | Status: AC
Start: 2024-02-14 — End: 2024-02-14

## 2024-02-14 MED ORDER — AMPHETAMINE-DEXTROAMPHETAMINE 10 MG PO TABS
10 | ORAL_TABLET | Freq: Every day | ORAL | 0 refills | 30.00000 days | Status: DC
Start: 2024-02-14 — End: 2024-08-10

## 2024-02-21 MED ORDER — FLUCONAZOLE 150 MG PO TABS
150 | ORAL_TABLET | Freq: Once | ORAL | 0 refills | 6.00000 days | Status: AC
Start: 2024-02-21 — End: 2024-02-21

## 2024-02-21 NOTE — Addendum Note (Signed)
 Addended by: Leeroy Pulley IV on: 02/21/2024 06:54 AM     Modules accepted: Orders

## 2024-03-16 ENCOUNTER — Encounter

## 2024-03-16 MED ORDER — LISDEXAMFETAMINE DIMESYLATE 60 MG PO CAPS
60 | ORAL_CAPSULE | Freq: Every day | ORAL | 0 refills | 30.00000 days | Status: DC
Start: 2024-03-16 — End: 2024-06-19

## 2024-03-16 MED ORDER — LISDEXAMFETAMINE DIMESYLATE 60 MG PO CAPS
60 | ORAL_CAPSULE | Freq: Every day | ORAL | 0 refills | 30.00000 days | Status: DC
Start: 2024-03-16 — End: 2024-08-10

## 2024-03-16 MED ORDER — AMPHETAMINE-DEXTROAMPHETAMINE 10 MG PO TABS
10 | ORAL_TABLET | Freq: Every day | ORAL | 0 refills | 30.00000 days | Status: DC
Start: 2024-03-16 — End: 2024-06-19

## 2024-05-29 ENCOUNTER — Encounter

## 2024-05-29 NOTE — Telephone Encounter (Signed)
 See the patient's email from today.  She complains of bilateral knee swelling with intermittent malar rash.  She lives in [place] .  I ordered lab work and made referral to rheumatology there.  Call back here as needed

## 2024-06-07 LAB — CBC WITH AUTO DIFFERENTIAL
Basophils %: 1 %
Basophils Absolute: 0 x10E3/uL (ref 0.0–0.2)
Eosinophils %: 1 %
Eosinophils Absolute: 0 x10E3/uL (ref 0.0–0.4)
Hematocrit: 42.4 % (ref 34.0–46.6)
Hemoglobin: 14.2 g/dL (ref 11.1–15.9)
Immature Grans (Abs): 0 x10E3/uL (ref 0.0–0.1)
Immature Granulocytes %: 0 %
Lymphocytes %: 37 %
Lymphocytes Absolute: 1.8 x10E3/uL (ref 0.7–3.1)
MCH: 31.4 pg (ref 26.6–33.0)
MCHC: 33.5 g/dL (ref 31.5–35.7)
MCV: 94 fL (ref 79–97)
Monocytes %: 4 %
Monocytes Absolute: 0.2 x10E3/uL (ref 0.1–0.9)
Neutrophils %: 57 %
Neutrophils Absolute: 2.8 x10E3/uL (ref 1.4–7.0)
Platelets: 293 x10E3/uL (ref 150–450)
RBC: 4.52 x10E6/uL (ref 3.77–5.28)
RDW: 11.8 % (ref 11.7–15.4)
WBC: 4.9 x10E3/uL (ref 3.4–10.8)

## 2024-06-07 LAB — C-REACTIVE PROTEIN: CRP: <1 mg/L (ref 0–10)

## 2024-06-07 LAB — SEDIMENTATION RATE: Sed Rate, Automated: 6 mm/h (ref 0–32)

## 2024-06-07 LAB — COMPREHENSIVE METABOLIC PANEL
ALT: 14 IU/L (ref 0–32)
AST: 13 IU/L (ref 0–40)
Albumin: 4.4 g/dL (ref 4.0–5.0)
Alkaline Phosphatase: 53 IU/L (ref 41–116)
BUN/Creatinine Ratio: 11 (ref 9–23)
BUN: 9 mg/dL (ref 6–20)
CO2: 23 mmol/L (ref 20–29)
Calcium: 9.4 mg/dL (ref 8.7–10.2)
Chloride: 101 mmol/L (ref 96–106)
Creatinine: 0.82 mg/dL (ref 0.57–1.00)
Est, Glom Filt Rate: 104 mL/min/1.73 (ref >59)
Globulin, Total: 2.2 g/dL (ref 1.5–4.5)
Glucose: 87 mg/dL (ref 70–99)
Potassium: 4.5 mmol/L (ref 3.5–5.2)
Sodium: 138 mmol/L (ref 134–144)
Total Bilirubin: 0.3 mg/dL (ref 0.0–1.2)
Total Protein: 6.6 g/dL (ref 6.0–8.5)

## 2024-06-07 LAB — ANA, RFLX TO 5-BIOMARKER PROFILE(ENA): Antinuclear Antibodies (ANA): NEGATIVE

## 2024-06-07 LAB — CK: Total CK: 77 U/L (ref 32–182)

## 2024-06-10 LAB — RHEUMATOID FACTOR BY TURB (RDL): Rheumatoid Factor: <14 [IU]/mL (ref <14)

## 2024-06-19 ENCOUNTER — Telehealth

## 2024-06-19 ENCOUNTER — Encounter

## 2024-06-19 MED ORDER — LISDEXAMFETAMINE DIMESYLATE 60 MG PO CAPS
60 | ORAL_CAPSULE | Freq: Every day | ORAL | 0 refills | 30.00000 days | Status: DC
Start: 2024-06-19 — End: 2024-08-17

## 2024-06-19 MED ORDER — AMPHETAMINE-DEXTROAMPHETAMINE 10 MG PO TABS
10 | ORAL_TABLET | Freq: Every day | ORAL | 0 refills | Status: DC
Start: 2024-06-19 — End: 2024-06-19

## 2024-06-19 MED ORDER — AMPHETAMINE-DEXTROAMPHETAMINE 10 MG PO TABS
10 | ORAL_TABLET | Freq: Every day | ORAL | 0 refills | Status: AC
Start: 2024-06-19 — End: 2024-08-18

## 2024-06-19 MED ORDER — LISDEXAMFETAMINE DIMESYLATE 60 MG PO CAPS
60 | ORAL_CAPSULE | Freq: Every day | ORAL | 0 refills | Status: DC
Start: 2024-06-19 — End: 2024-06-19

## 2024-06-19 NOTE — Telephone Encounter (Signed)
"  Pt needs a refill on the     Vyvanse  60 mg (Generic)     Ph# [phone]    [id number] - [place], [place] - [id number] [phone] - [phone]   "

## 2024-06-28 NOTE — Telephone Encounter (Signed)
 Attempted to contact patient to schedule IV appointment. LVM with contact information for call back.

## 2024-08-02 ENCOUNTER — Ambulatory Visit: Admit: 2024-08-02 | Payer: PRIVATE HEALTH INSURANCE | Attending: Internal Medicine | Primary: Internal Medicine

## 2024-08-02 VITALS — BP 100/70 | HR 64 | Temp 97.50000°F | Resp 16 | Ht 70.0 in | Wt 170.0 lb

## 2024-08-02 DIAGNOSIS — M2550 Pain in unspecified joint: Principal | ICD-10-CM

## 2024-08-02 NOTE — Progress Notes (Signed)
 "Comprehensive Concierge Physical      Elaine Terrell  23 y.o.  Lazette Estala/f CC: joint pain     HPI    She graduated from  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 She has oligo articular  Juvenille Idiopathic Arthritis, including  her left knee treated in the past  by Rheumatologist Dr Merrilyn Blanch of [facility] in [place], [place]. In 2014  she was treated with a Left knee Depo Medrol  injection and  Naproxen . Then  in  2015 she went into  remission. She has had mechanical joint pain also. In 2022  she had bilateral knee pain and swelling. Dr Blanch  felt that this was mechanical  knee pain with Chondromalacia Patella.  She has had knee and hip pain requring PT.  Three months ago she developed  pain, swelling, and redness  in her knees and ankles; pain in her fingers;  and headache.  Her lab work then showed a normal Sed rate,CRP, CK; and a negative RF and ANA. She has taken no medicine for her symptoms.  Historically she tolerates NSAIDS for only short periods of time due to a tendency to GI upset when taking longer courses of NSAIDS. In  rainy weather she tends to have diffuse joint pain. At times she experiences a  mid face flush. She denies significant morning stiffness.  Walking in the morning helps her joint pain.  Now her  joints feel  improved. She denies having a tick bite.     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 oligo articular    Concussion     age 24. no LOC.    History of seasonal allergies     IBS (irritable bowel syndrome)     MRSA (methicillin resistant staph aureus) culture positive     fron ingrown toenail culture in childhood.    Patellofemoral arthritis      No past surgical history on file.  Current Outpatient Medications   Medication Sig Dispense Refill    amphetamine -dextroamphetamine  (ADDERALL, 10MG ,) 10 MG tablet Take  1 tablet by mouth daily for 60 days. Max Daily Amount: 10 mg 60 tablet 0    Lisdexamfetamine  Dimesylate 60 MG CAPS Take 60 mg by mouth daily for 60 days. Max Daily Amount: 60 mg 60 capsule 0    NONFORMULARY Magnesium glycinate 2 pills each bedtime. Probiotic Mega sporebiotic one daily . Fish oil one daily      spironolactone  (ALDACTONE ) 50 MG tablet Take 3 tablets by mouth daily      norethindrone-ethinyl estradiol-Fe (LO LOESTRIN FE) 1 MG-10 MCG / 10 MCG tablet ceived the following from [facility]: Outside name: Lo Loestrin Fe 1 mg-10 mcg (24)/10 mcg (2) tab       No current facility-administered medications for this visit.     Allergies   Allergen Reactions    Isotretinoin Rash     Social History     Tobacco Use    Smoking status: Never    Smokeless tobacco: Never   Substance Use Topics    Alcohol use: Yes     Alcohol/week: 2.0 - 4.0 standard drinks of alcohol     Types: 2 - 4 Standard drinks or equivalent per week      No  family history on file. Adopted       Routine Health Measures/screenings:  Vaccinations:            TDAP (Tetanus, Diphtheria, Pertussis):     04/24/2013            Influenza:                                                    05/14/2020            COVID-19:                                                  12/01/2019, 12/22/2019, 07/29/2020             Hepatitis B                                                 08/22/2002, 03/03/2022, 04/24/2022                Routine eye examination (including glaucoma screening):    last done 6 years ago     Routine dental exam:                         up to date     Profeesional : Engineer, 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      Lifestyle Habits:    Alcohol use:             4 drinks , 1 time per week     Tobacco use:          denies ever using     Exercise 1 hr daily     Diet- healthy     ROS:     She denies having a current  headache or visual  difficulteis now. She denies nasal, ocular, or oral ulcers.   She denies shortness of  breath,cough, or chest pain. She denies abdominal  pain or  indigestion . Her bm's move normally without visible blood. Dermatolgy is up to date and favorable.      She reports having had one sexual partner in her life and voices knowledge of safe sex. She reports being currently not sexually active and being up to date with her GYN , including a PAP smear earlier this year. She denies genitourinary symptoms now    Objective:  BP 100/70   Pulse 64   Temp 97.5 F (36.4 C)   Resp 16   Ht 1.778 m (5' 10)   Wt 77.1 kg (170 lb)   BMI 24.39 kg/m     Physical Exam  Head/eyes/throat -- No gross cranial abnormalities.  Oro-pharynx clear.  Neck -- No thyroid enlargement or nodules. No carotid bruits.  Heart -- Regular rate & rhythm.  No murmurs.  Lungs -- Clear to auscultation.  Abd -- Soft. Non-tender. Non-distended. No masses. Bowel sounds present.  Extremities --  right thumb base slightly tender   Joints : full range of motion. No synovitis    No edema.  Skin --  No suspicious skin lesions.  Neuro: grossly non focal           EKG & Labs:   See separate report(s).        Assessment/Recommendations:     Diagnosis Orders   1. Multiple joint pain        2. Juvenile idiopathic arthritis (HCC)        3. ADD (attention deficit disorder) without hyperactivity        4. Elevated cholesterol        5. Seasonal allergic rhinitis, unspecified trigger        6. Acne, unspecified acne type        7. Irritable bowel syndrome, unspecified type            The cause of her painful knees, ankles and fingers she is under evaluation.  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  On her current lab work , she has a normal level of C-Reactive Protein, Cardiac  0.91.    2.  She has Juvenile idiopathic arthritis. It is unclear if it is active now.     Plan : I advised that she use Diclofenac 1% gel, applying  2 to 4 grams up  to 4 times daily as needed to painful joints.   She may continue to exercise as tolerated. She has an  appointment on 08/14/2024 with [facility]     3. ADD  is responding to Vyvanse  and Adderall. Her PMP is up to date.     Plan : continue Vyvanse  and Adderall. In the next month, give a urine sample here for the Urine Drug Screen, which the [place]  requires because Vyvanse  and Adderall are controlled substances.     4. Her Total Cholesterol 268 and her LDL bad cholesterol 163 are mildly elevated, but are partially offset by the ideal level of HDL good cholesterol 90.  The triglyceride fat level in your bloodstream 88 is normal.  Use a healthy diet that is low in cholesterol.  Continue to exercise.  I will recheck the cholesterol again in 1 year at your next physical    5.  Her seasonal allergies are inactive now.  Should they flareup she may use Claritin 10 mg daily as needed , Allegra 180 mg daily as needed ,Zyrtec 10 mg at bedtime as needed, or Flonase 2 sprays in each nostril daily as needed    6.  Her acne appears well-controlled.  Continue the current program    7.  Her irritable bowel syndrome appears inactive.    Health maintenance-have a flu shot this fall.  Continue routine GYN care    Return here in 1 year for the next concierge physical or sooner as needed               Author:  Ryan Flake, MD  "

## 2024-08-02 NOTE — Patient Instructions (Signed)
"  Return in 1 year for next physical   "

## 2024-08-17 ENCOUNTER — Encounter

## 2024-08-17 MED ORDER — LISDEXAMFETAMINE DIMESYLATE 60 MG PO CAPS
60 | ORAL_CAPSULE | Freq: Every day | ORAL | 0 refills | 30.00000 days | Status: DC
Start: 2024-08-17 — End: 2024-08-21

## 2024-08-21 ENCOUNTER — Encounter

## 2024-08-21 MED ORDER — LISDEXAMFETAMINE DIMESYLATE 60 MG PO CAPS
60 | ORAL_CAPSULE | Freq: Every day | ORAL | 0 refills | 30.00000 days | Status: DC
Start: 2024-08-21 — End: 2024-09-05

## 2024-09-01 ENCOUNTER — Encounter

## 2024-09-02 MED ORDER — LISDEXAMFETAMINE DIMESYLATE 60 MG PO CAPS
60 | ORAL_CAPSULE | Freq: Every day | ORAL | 0 refills | 30.00000 days | Status: DC
Start: 2024-09-02 — End: 2024-09-05

## 2024-09-02 NOTE — Telephone Encounter (Signed)
"  ERX Brand Vyvanse  60 mg to [facility] [address] [facility].   "

## 2024-09-05 ENCOUNTER — Ambulatory Visit: Admit: 2024-09-05 | Payer: PRIVATE HEALTH INSURANCE | Attending: Internal Medicine | Primary: Internal Medicine

## 2024-09-05 VITALS — BP 100/78 | HR 88 | Temp 97.20000°F | Ht 70.0 in | Wt 178.0 lb

## 2024-09-05 DIAGNOSIS — F900 Attention-deficit hyperactivity disorder, predominantly inattentive type: Principal | ICD-10-CM

## 2024-09-05 MED ORDER — AMPHETAMINE-DEXTROAMPHET ER 25 MG PO CP24
25 | ORAL_CAPSULE | Freq: Every morning | ORAL | 0 refills | 30.00000 days | Status: DC
Start: 2024-09-05 — End: 2024-09-25

## 2024-09-05 NOTE — Patient Instructions (Signed)
"  Stop Vyvanse    Start generic extended release Adderall XR 25 mg one pill each   morning   Continue Adderall 10 mg in the afternoon   Call back as needed   "

## 2024-09-05 NOTE — Progress Notes (Signed)
"  Elaine Terrell is a 23 y.o. 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 oligo articular    Concussion     age 61. no LOC.    History of seasonal allergies     IBS (irritable bowel syndrome)     MRSA (methicillin resistant staph aureus) culture positive     fron ingrown toenail culture in childhood.    Patellofemoral arthritis      Current Outpatient Medications   Medication Sig Dispense Refill    amphetamine -dextroamphetamine  (ADDERALL XR) 25 MG extended release capsule Take 1 capsule by mouth every morning for 30 days. Use in place of Vyvanse  Max Daily Amount: 25 mg 30 capsule 0    amphetamine -dextroamphetamine  (ADDERALL, 10MG ,) 10 MG tablet Take 1 tablet by mouth daily for 60 days. Max Daily Amount: 10 mg 60 tablet 0    NONFORMULARY Magnesium glycinate 2 pills each bedtime. Probiotic Mega sporebiotic one daily . Fish oil one daily      spironolactone  (ALDACTONE ) 50 MG tablet Take 3 tablets by mouth daily      norethindrone-ethinyl estradiol-Fe (LO LOESTRIN FE) 1 MG-10 MCG / 10 MCG tablet ceived the following from [facility]: Outside name: Lo Loestrin Fe 1 mg-10 mcg (24)/10 mcg (2) tab       No current facility-administered medications for this visit.     Allergies   Allergen Reactions    Isotretinoin Rash     Social History     Tobacco Use   Smoking Status Never   Smokeless Tobacco Never       Review of Systems  Review of Systems - negative except as per HPI      Physical Examination:  BP 100/78   Pulse 88   Temp 97.2 F (36.2 C)   Ht 1.778 m (5' 10)   Wt 80.7 kg (178 lb)   BMI 25.54 kg/m   Physical Exam    General: Appears in no acute distress  Lung-  Heart - reg  Abdomen -  Extremities -     Labs: No results found for this or any previous visit (from  the past 8 hours).    No results found for this visit on 09/05/24.     Assessment/Plan   Diagnosis Orders   1. Attention-deficit hyperactivity disorder, predominantly inattentive type  amphetamine -dextroamphetamine  (ADDERALL XR) 25 MG extended release capsule        PMP appears in order   D/c Vyvanse    Add  Adderall XR 25 mg qd   Continue Adderall IR 10 mg in the afternoon.   Follow up  PRN.    Author:  Ryan Flake, MD 10:51 PM12/30/2025  "

## 2024-09-23 ENCOUNTER — Encounter

## 2024-09-25 MED ORDER — AMPHETAMINE-DEXTROAMPHET ER 30 MG PO CP24
30 | ORAL_CAPSULE | Freq: Every day | ORAL | 0 refills | 30.00000 days | Status: AC
Start: 2024-09-25 — End: 2024-10-25

## 2024-09-25 NOTE — Addendum Note (Signed)
 Addended by: ROSAMOND MICAEL [facility]ENHOUSE IV on: 09/25/2024 08:02 AM     Modules accepted: Orders
